# Patient Record
Sex: Male | Born: 1950
Health system: Southern US, Community
[De-identification: ages and names within clinical notes are randomized; demographics above are authoritative.]

## PROBLEM LIST (undated history)

## (undated) DIAGNOSIS — R079 Chest pain, unspecified: Secondary | ICD-10-CM

## (undated) DIAGNOSIS — N419 Inflammatory disease of prostate, unspecified: Secondary | ICD-10-CM

## (undated) DIAGNOSIS — E669 Obesity, unspecified: Secondary | ICD-10-CM

## (undated) DIAGNOSIS — H409 Unspecified glaucoma: Secondary | ICD-10-CM

## (undated) DIAGNOSIS — E119 Type 2 diabetes mellitus without complications: Secondary | ICD-10-CM

## (undated) DIAGNOSIS — K219 Gastro-esophageal reflux disease without esophagitis: Secondary | ICD-10-CM

## (undated) DIAGNOSIS — T7840XA Allergy, unspecified, initial encounter: Secondary | ICD-10-CM

## (undated) DIAGNOSIS — K635 Polyp of colon: Secondary | ICD-10-CM

## (undated) DIAGNOSIS — I499 Cardiac arrhythmia, unspecified: Secondary | ICD-10-CM

## (undated) DIAGNOSIS — G4730 Sleep apnea, unspecified: Secondary | ICD-10-CM

## (undated) DIAGNOSIS — E1149 Type 2 diabetes mellitus with other diabetic neurological complication: Secondary | ICD-10-CM

## (undated) DIAGNOSIS — E785 Hyperlipidemia, unspecified: Secondary | ICD-10-CM

## (undated) DIAGNOSIS — J45909 Unspecified asthma, uncomplicated: Secondary | ICD-10-CM

## (undated) DIAGNOSIS — M1990 Unspecified osteoarthritis, unspecified site: Secondary | ICD-10-CM

## (undated) DIAGNOSIS — E1140 Type 2 diabetes mellitus with diabetic neuropathy, unspecified: Secondary | ICD-10-CM

## (undated) DIAGNOSIS — F329 Major depressive disorder, single episode, unspecified: Secondary | ICD-10-CM

## (undated) DIAGNOSIS — K759 Inflammatory liver disease, unspecified: Secondary | ICD-10-CM

## (undated) DIAGNOSIS — I10 Essential (primary) hypertension: Secondary | ICD-10-CM

## (undated) DIAGNOSIS — K5792 Diverticulitis of intestine, part unspecified, without perforation or abscess without bleeding: Secondary | ICD-10-CM

## (undated) DIAGNOSIS — F32A Depression, unspecified: Secondary | ICD-10-CM

## (undated) DIAGNOSIS — M109 Gout, unspecified: Secondary | ICD-10-CM

## (undated) DIAGNOSIS — J189 Pneumonia, unspecified organism: Secondary | ICD-10-CM

## (undated) HISTORY — DX: Gout, unspecified: M10.9

## (undated) HISTORY — DX: Sleep apnea, unspecified: G47.30

## (undated) HISTORY — PX: BACK SURGERY: SHX140

## (undated) HISTORY — DX: Type 2 diabetes mellitus with diabetic neuropathy, unspecified: E11.40

## (undated) HISTORY — DX: Inflammatory disease of prostate, unspecified: N41.9

## (undated) HISTORY — DX: Unspecified glaucoma: H40.9

## (undated) HISTORY — DX: Hyperlipidemia, unspecified: E78.5

## (undated) HISTORY — PX: VASECTOMY: SHX75

## (undated) HISTORY — DX: Gastro-esophageal reflux disease without esophagitis: K21.9

## (undated) HISTORY — DX: Chest pain, unspecified: R07.9

## (undated) HISTORY — PX: KNEE ARTHROSCOPY: SUR90

## (undated) HISTORY — DX: Obesity, unspecified: E66.9

## (undated) HISTORY — DX: Unspecified asthma, uncomplicated: J45.909

## (undated) HISTORY — PX: TONSILLECTOMY AND ADENOIDECTOMY: SUR1326

## (undated) HISTORY — DX: Depression, unspecified: F32.A

## (undated) HISTORY — DX: Type 2 diabetes mellitus with diabetic neuropathy, unspecified: E11.49

## (undated) HISTORY — DX: Unspecified osteoarthritis, unspecified site: M19.90

## (undated) HISTORY — PX: SHOULDER ARTHROSCOPY: SHX128

## (undated) HISTORY — DX: Essential (primary) hypertension: I10

## (undated) HISTORY — PX: TIBIA FRACTURE SURGERY: SHX806

## (undated) HISTORY — DX: Allergy, unspecified, initial encounter: T78.40XA

---
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
## 2005-10-11 HISTORY — PX: BACK SURGERY: SHX140

## 2016-05-27 HISTORY — PX: COLONOSCOPY: SHX174

## 2016-06-07 DIAGNOSIS — E119 Type 2 diabetes mellitus without complications: Secondary | ICD-10-CM | POA: Diagnosis not present

## 2016-06-07 DIAGNOSIS — Z125 Encounter for screening for malignant neoplasm of prostate: Secondary | ICD-10-CM | POA: Diagnosis not present

## 2016-06-07 DIAGNOSIS — M159 Polyosteoarthritis, unspecified: Secondary | ICD-10-CM | POA: Diagnosis not present

## 2016-06-07 DIAGNOSIS — I10 Essential (primary) hypertension: Secondary | ICD-10-CM | POA: Diagnosis not present

## 2016-06-07 DIAGNOSIS — E785 Hyperlipidemia, unspecified: Secondary | ICD-10-CM | POA: Diagnosis not present

## 2016-07-08 DIAGNOSIS — K219 Gastro-esophageal reflux disease without esophagitis: Secondary | ICD-10-CM | POA: Diagnosis not present

## 2016-07-08 DIAGNOSIS — R6884 Jaw pain: Secondary | ICD-10-CM | POA: Diagnosis not present

## 2016-07-08 DIAGNOSIS — M159 Polyosteoarthritis, unspecified: Secondary | ICD-10-CM | POA: Diagnosis not present

## 2016-07-08 DIAGNOSIS — E119 Type 2 diabetes mellitus without complications: Secondary | ICD-10-CM | POA: Diagnosis not present

## 2016-09-08 DIAGNOSIS — J029 Acute pharyngitis, unspecified: Secondary | ICD-10-CM | POA: Diagnosis not present

## 2016-09-08 DIAGNOSIS — I10 Essential (primary) hypertension: Secondary | ICD-10-CM | POA: Diagnosis not present

## 2016-09-08 DIAGNOSIS — E119 Type 2 diabetes mellitus without complications: Secondary | ICD-10-CM | POA: Diagnosis not present

## 2016-09-08 DIAGNOSIS — K219 Gastro-esophageal reflux disease without esophagitis: Secondary | ICD-10-CM | POA: Diagnosis not present

## 2016-12-09 DIAGNOSIS — J45909 Unspecified asthma, uncomplicated: Secondary | ICD-10-CM | POA: Diagnosis not present

## 2016-12-09 DIAGNOSIS — H409 Unspecified glaucoma: Secondary | ICD-10-CM | POA: Diagnosis not present

## 2016-12-09 DIAGNOSIS — E119 Type 2 diabetes mellitus without complications: Secondary | ICD-10-CM | POA: Diagnosis not present

## 2016-12-09 DIAGNOSIS — J029 Acute pharyngitis, unspecified: Secondary | ICD-10-CM | POA: Diagnosis not present

## 2017-01-20 DIAGNOSIS — M159 Polyosteoarthritis, unspecified: Secondary | ICD-10-CM | POA: Diagnosis not present

## 2017-01-20 DIAGNOSIS — E785 Hyperlipidemia, unspecified: Secondary | ICD-10-CM | POA: Diagnosis not present

## 2017-01-20 DIAGNOSIS — J302 Other seasonal allergic rhinitis: Secondary | ICD-10-CM | POA: Diagnosis not present

## 2017-01-20 DIAGNOSIS — E119 Type 2 diabetes mellitus without complications: Secondary | ICD-10-CM | POA: Diagnosis not present

## 2017-08-11 DIAGNOSIS — S82899A Other fracture of unspecified lower leg, initial encounter for closed fracture: Secondary | ICD-10-CM

## 2017-08-11 HISTORY — DX: Other fracture of unspecified lower leg, initial encounter for closed fracture: S82.899A

## 2017-08-17 DIAGNOSIS — S82891A Other fracture of right lower leg, initial encounter for closed fracture: Secondary | ICD-10-CM | POA: Diagnosis not present

## 2017-08-17 DIAGNOSIS — S8291XA Unspecified fracture of right lower leg, initial encounter for closed fracture: Secondary | ICD-10-CM | POA: Diagnosis not present

## 2017-08-17 DIAGNOSIS — E119 Type 2 diabetes mellitus without complications: Secondary | ICD-10-CM | POA: Diagnosis not present

## 2017-08-17 DIAGNOSIS — S82441A Displaced spiral fracture of shaft of right fibula, initial encounter for closed fracture: Secondary | ICD-10-CM | POA: Diagnosis not present

## 2017-08-22 DIAGNOSIS — S82401A Unspecified fracture of shaft of right fibula, initial encounter for closed fracture: Secondary | ICD-10-CM | POA: Diagnosis not present

## 2017-08-26 DIAGNOSIS — I10 Essential (primary) hypertension: Secondary | ICD-10-CM | POA: Diagnosis not present

## 2017-08-26 DIAGNOSIS — N400 Enlarged prostate without lower urinary tract symptoms: Secondary | ICD-10-CM | POA: Diagnosis not present

## 2017-08-26 DIAGNOSIS — G8918 Other acute postprocedural pain: Secondary | ICD-10-CM | POA: Diagnosis not present

## 2017-08-26 DIAGNOSIS — Z79899 Other long term (current) drug therapy: Secondary | ICD-10-CM | POA: Diagnosis not present

## 2017-08-26 DIAGNOSIS — E119 Type 2 diabetes mellitus without complications: Secondary | ICD-10-CM | POA: Diagnosis not present

## 2017-08-26 DIAGNOSIS — Z7984 Long term (current) use of oral hypoglycemic drugs: Secondary | ICD-10-CM | POA: Diagnosis not present

## 2017-08-26 DIAGNOSIS — S82431A Displaced oblique fracture of shaft of right fibula, initial encounter for closed fracture: Secondary | ICD-10-CM | POA: Diagnosis not present

## 2017-08-26 DIAGNOSIS — E785 Hyperlipidemia, unspecified: Secondary | ICD-10-CM | POA: Diagnosis not present

## 2017-08-26 DIAGNOSIS — S9301XA Subluxation of right ankle joint, initial encounter: Secondary | ICD-10-CM | POA: Diagnosis not present

## 2017-08-26 DIAGNOSIS — S93431A Sprain of tibiofibular ligament of right ankle, initial encounter: Secondary | ICD-10-CM | POA: Diagnosis not present

## 2017-08-26 DIAGNOSIS — S82841A Displaced bimalleolar fracture of right lower leg, initial encounter for closed fracture: Secondary | ICD-10-CM | POA: Diagnosis not present

## 2017-09-07 DIAGNOSIS — S82401A Unspecified fracture of shaft of right fibula, initial encounter for closed fracture: Secondary | ICD-10-CM | POA: Diagnosis not present

## 2017-10-05 DIAGNOSIS — J45909 Unspecified asthma, uncomplicated: Secondary | ICD-10-CM | POA: Diagnosis not present

## 2017-10-05 DIAGNOSIS — E785 Hyperlipidemia, unspecified: Secondary | ICD-10-CM | POA: Diagnosis not present

## 2017-10-05 DIAGNOSIS — K219 Gastro-esophageal reflux disease without esophagitis: Secondary | ICD-10-CM | POA: Diagnosis not present

## 2017-10-05 DIAGNOSIS — R0789 Other chest pain: Secondary | ICD-10-CM | POA: Diagnosis not present

## 2017-10-05 DIAGNOSIS — Z9889 Other specified postprocedural states: Secondary | ICD-10-CM | POA: Diagnosis not present

## 2017-10-05 DIAGNOSIS — R072 Precordial pain: Secondary | ICD-10-CM | POA: Diagnosis not present

## 2017-10-05 DIAGNOSIS — Z7984 Long term (current) use of oral hypoglycemic drugs: Secondary | ICD-10-CM | POA: Diagnosis not present

## 2017-10-05 DIAGNOSIS — Z8781 Personal history of (healed) traumatic fracture: Secondary | ICD-10-CM | POA: Diagnosis not present

## 2017-10-05 DIAGNOSIS — R110 Nausea: Secondary | ICD-10-CM | POA: Diagnosis not present

## 2017-10-05 DIAGNOSIS — I10 Essential (primary) hypertension: Secondary | ICD-10-CM | POA: Diagnosis not present

## 2017-10-05 DIAGNOSIS — Z79899 Other long term (current) drug therapy: Secondary | ICD-10-CM | POA: Diagnosis not present

## 2017-10-05 DIAGNOSIS — E7800 Pure hypercholesterolemia, unspecified: Secondary | ICD-10-CM | POA: Diagnosis not present

## 2017-10-05 DIAGNOSIS — R079 Chest pain, unspecified: Secondary | ICD-10-CM | POA: Diagnosis not present

## 2017-10-05 DIAGNOSIS — R0600 Dyspnea, unspecified: Secondary | ICD-10-CM | POA: Diagnosis not present

## 2017-10-05 DIAGNOSIS — E119 Type 2 diabetes mellitus without complications: Secondary | ICD-10-CM | POA: Diagnosis not present

## 2017-10-05 DIAGNOSIS — S82401A Unspecified fracture of shaft of right fibula, initial encounter for closed fracture: Secondary | ICD-10-CM | POA: Diagnosis not present

## 2017-10-06 DIAGNOSIS — R079 Chest pain, unspecified: Secondary | ICD-10-CM | POA: Diagnosis not present

## 2017-10-06 DIAGNOSIS — R0789 Other chest pain: Secondary | ICD-10-CM | POA: Diagnosis not present

## 2017-10-06 DIAGNOSIS — M7989 Other specified soft tissue disorders: Secondary | ICD-10-CM | POA: Diagnosis not present

## 2017-10-06 DIAGNOSIS — I10 Essential (primary) hypertension: Secondary | ICD-10-CM | POA: Diagnosis not present

## 2017-10-06 DIAGNOSIS — E119 Type 2 diabetes mellitus without complications: Secondary | ICD-10-CM | POA: Diagnosis not present

## 2017-10-10 DIAGNOSIS — S82401D Unspecified fracture of shaft of right fibula, subsequent encounter for closed fracture with routine healing: Secondary | ICD-10-CM | POA: Diagnosis not present

## 2017-10-10 DIAGNOSIS — M6281 Muscle weakness (generalized): Secondary | ICD-10-CM | POA: Diagnosis not present

## 2017-10-10 DIAGNOSIS — M25671 Stiffness of right ankle, not elsewhere classified: Secondary | ICD-10-CM | POA: Diagnosis not present

## 2017-10-10 DIAGNOSIS — R2689 Other abnormalities of gait and mobility: Secondary | ICD-10-CM | POA: Diagnosis not present

## 2017-10-10 DIAGNOSIS — M25571 Pain in right ankle and joints of right foot: Secondary | ICD-10-CM | POA: Diagnosis not present

## 2017-10-14 DIAGNOSIS — R2689 Other abnormalities of gait and mobility: Secondary | ICD-10-CM | POA: Diagnosis not present

## 2017-10-14 DIAGNOSIS — M25571 Pain in right ankle and joints of right foot: Secondary | ICD-10-CM | POA: Diagnosis not present

## 2017-10-14 DIAGNOSIS — S82401D Unspecified fracture of shaft of right fibula, subsequent encounter for closed fracture with routine healing: Secondary | ICD-10-CM | POA: Diagnosis not present

## 2017-10-14 DIAGNOSIS — M6281 Muscle weakness (generalized): Secondary | ICD-10-CM | POA: Diagnosis not present

## 2017-10-14 DIAGNOSIS — M25671 Stiffness of right ankle, not elsewhere classified: Secondary | ICD-10-CM | POA: Diagnosis not present

## 2017-10-17 DIAGNOSIS — M6281 Muscle weakness (generalized): Secondary | ICD-10-CM | POA: Diagnosis not present

## 2017-10-17 DIAGNOSIS — M25571 Pain in right ankle and joints of right foot: Secondary | ICD-10-CM | POA: Diagnosis not present

## 2017-10-17 DIAGNOSIS — M25671 Stiffness of right ankle, not elsewhere classified: Secondary | ICD-10-CM | POA: Diagnosis not present

## 2017-10-17 DIAGNOSIS — S82401D Unspecified fracture of shaft of right fibula, subsequent encounter for closed fracture with routine healing: Secondary | ICD-10-CM | POA: Diagnosis not present

## 2017-10-17 DIAGNOSIS — R2689 Other abnormalities of gait and mobility: Secondary | ICD-10-CM | POA: Diagnosis not present

## 2017-10-24 DIAGNOSIS — S82401D Unspecified fracture of shaft of right fibula, subsequent encounter for closed fracture with routine healing: Secondary | ICD-10-CM | POA: Diagnosis not present

## 2017-10-24 DIAGNOSIS — M25571 Pain in right ankle and joints of right foot: Secondary | ICD-10-CM | POA: Diagnosis not present

## 2017-10-24 DIAGNOSIS — R2689 Other abnormalities of gait and mobility: Secondary | ICD-10-CM | POA: Diagnosis not present

## 2017-10-24 DIAGNOSIS — M6281 Muscle weakness (generalized): Secondary | ICD-10-CM | POA: Diagnosis not present

## 2017-10-24 DIAGNOSIS — M25671 Stiffness of right ankle, not elsewhere classified: Secondary | ICD-10-CM | POA: Diagnosis not present

## 2017-10-27 DIAGNOSIS — R2689 Other abnormalities of gait and mobility: Secondary | ICD-10-CM | POA: Diagnosis not present

## 2017-10-27 DIAGNOSIS — M6281 Muscle weakness (generalized): Secondary | ICD-10-CM | POA: Diagnosis not present

## 2017-10-27 DIAGNOSIS — M25671 Stiffness of right ankle, not elsewhere classified: Secondary | ICD-10-CM | POA: Diagnosis not present

## 2017-10-27 DIAGNOSIS — M25571 Pain in right ankle and joints of right foot: Secondary | ICD-10-CM | POA: Diagnosis not present

## 2017-10-27 DIAGNOSIS — S82401D Unspecified fracture of shaft of right fibula, subsequent encounter for closed fracture with routine healing: Secondary | ICD-10-CM | POA: Diagnosis not present

## 2017-11-01 DIAGNOSIS — S82401D Unspecified fracture of shaft of right fibula, subsequent encounter for closed fracture with routine healing: Secondary | ICD-10-CM | POA: Diagnosis not present

## 2017-11-01 DIAGNOSIS — M25571 Pain in right ankle and joints of right foot: Secondary | ICD-10-CM | POA: Diagnosis not present

## 2017-11-01 DIAGNOSIS — M25671 Stiffness of right ankle, not elsewhere classified: Secondary | ICD-10-CM | POA: Diagnosis not present

## 2017-11-01 DIAGNOSIS — R2689 Other abnormalities of gait and mobility: Secondary | ICD-10-CM | POA: Diagnosis not present

## 2017-11-01 DIAGNOSIS — M6281 Muscle weakness (generalized): Secondary | ICD-10-CM | POA: Diagnosis not present

## 2017-11-04 DIAGNOSIS — M6281 Muscle weakness (generalized): Secondary | ICD-10-CM | POA: Diagnosis not present

## 2017-11-04 DIAGNOSIS — R2689 Other abnormalities of gait and mobility: Secondary | ICD-10-CM | POA: Diagnosis not present

## 2017-11-04 DIAGNOSIS — S82401D Unspecified fracture of shaft of right fibula, subsequent encounter for closed fracture with routine healing: Secondary | ICD-10-CM | POA: Diagnosis not present

## 2017-11-04 DIAGNOSIS — M25671 Stiffness of right ankle, not elsewhere classified: Secondary | ICD-10-CM | POA: Diagnosis not present

## 2017-11-04 DIAGNOSIS — M25571 Pain in right ankle and joints of right foot: Secondary | ICD-10-CM | POA: Diagnosis not present

## 2017-11-17 DIAGNOSIS — R2689 Other abnormalities of gait and mobility: Secondary | ICD-10-CM | POA: Diagnosis not present

## 2017-11-17 DIAGNOSIS — M6281 Muscle weakness (generalized): Secondary | ICD-10-CM | POA: Diagnosis not present

## 2017-11-17 DIAGNOSIS — S82401D Unspecified fracture of shaft of right fibula, subsequent encounter for closed fracture with routine healing: Secondary | ICD-10-CM | POA: Diagnosis not present

## 2017-11-17 DIAGNOSIS — M25571 Pain in right ankle and joints of right foot: Secondary | ICD-10-CM | POA: Diagnosis not present

## 2017-11-17 DIAGNOSIS — M25671 Stiffness of right ankle, not elsewhere classified: Secondary | ICD-10-CM | POA: Diagnosis not present

## 2017-11-21 DIAGNOSIS — M6281 Muscle weakness (generalized): Secondary | ICD-10-CM | POA: Diagnosis not present

## 2017-11-21 DIAGNOSIS — M25671 Stiffness of right ankle, not elsewhere classified: Secondary | ICD-10-CM | POA: Diagnosis not present

## 2017-11-21 DIAGNOSIS — S82401A Unspecified fracture of shaft of right fibula, initial encounter for closed fracture: Secondary | ICD-10-CM | POA: Diagnosis not present

## 2017-11-21 DIAGNOSIS — S82401D Unspecified fracture of shaft of right fibula, subsequent encounter for closed fracture with routine healing: Secondary | ICD-10-CM | POA: Diagnosis not present

## 2017-11-21 DIAGNOSIS — R2689 Other abnormalities of gait and mobility: Secondary | ICD-10-CM | POA: Diagnosis not present

## 2017-11-21 DIAGNOSIS — M25571 Pain in right ankle and joints of right foot: Secondary | ICD-10-CM | POA: Diagnosis not present

## 2017-11-25 DIAGNOSIS — M6281 Muscle weakness (generalized): Secondary | ICD-10-CM | POA: Diagnosis not present

## 2017-11-25 DIAGNOSIS — S82401D Unspecified fracture of shaft of right fibula, subsequent encounter for closed fracture with routine healing: Secondary | ICD-10-CM | POA: Diagnosis not present

## 2017-11-25 DIAGNOSIS — M25671 Stiffness of right ankle, not elsewhere classified: Secondary | ICD-10-CM | POA: Diagnosis not present

## 2017-11-25 DIAGNOSIS — M25571 Pain in right ankle and joints of right foot: Secondary | ICD-10-CM | POA: Diagnosis not present

## 2017-11-25 DIAGNOSIS — R2689 Other abnormalities of gait and mobility: Secondary | ICD-10-CM | POA: Diagnosis not present

## 2017-12-02 DIAGNOSIS — S82401D Unspecified fracture of shaft of right fibula, subsequent encounter for closed fracture with routine healing: Secondary | ICD-10-CM | POA: Diagnosis not present

## 2017-12-02 DIAGNOSIS — R2689 Other abnormalities of gait and mobility: Secondary | ICD-10-CM | POA: Diagnosis not present

## 2017-12-02 DIAGNOSIS — M6281 Muscle weakness (generalized): Secondary | ICD-10-CM | POA: Diagnosis not present

## 2017-12-02 DIAGNOSIS — M25671 Stiffness of right ankle, not elsewhere classified: Secondary | ICD-10-CM | POA: Diagnosis not present

## 2017-12-02 DIAGNOSIS — M25571 Pain in right ankle and joints of right foot: Secondary | ICD-10-CM | POA: Diagnosis not present

## 2017-12-05 DIAGNOSIS — M6281 Muscle weakness (generalized): Secondary | ICD-10-CM | POA: Diagnosis not present

## 2017-12-05 DIAGNOSIS — R2689 Other abnormalities of gait and mobility: Secondary | ICD-10-CM | POA: Diagnosis not present

## 2017-12-05 DIAGNOSIS — S82401D Unspecified fracture of shaft of right fibula, subsequent encounter for closed fracture with routine healing: Secondary | ICD-10-CM | POA: Diagnosis not present

## 2017-12-05 DIAGNOSIS — M25671 Stiffness of right ankle, not elsewhere classified: Secondary | ICD-10-CM | POA: Diagnosis not present

## 2017-12-05 DIAGNOSIS — M25571 Pain in right ankle and joints of right foot: Secondary | ICD-10-CM | POA: Diagnosis not present

## 2017-12-08 DIAGNOSIS — R2689 Other abnormalities of gait and mobility: Secondary | ICD-10-CM | POA: Diagnosis not present

## 2017-12-08 DIAGNOSIS — M25671 Stiffness of right ankle, not elsewhere classified: Secondary | ICD-10-CM | POA: Diagnosis not present

## 2017-12-08 DIAGNOSIS — S82401D Unspecified fracture of shaft of right fibula, subsequent encounter for closed fracture with routine healing: Secondary | ICD-10-CM | POA: Diagnosis not present

## 2017-12-08 DIAGNOSIS — M25571 Pain in right ankle and joints of right foot: Secondary | ICD-10-CM | POA: Diagnosis not present

## 2017-12-08 DIAGNOSIS — M6281 Muscle weakness (generalized): Secondary | ICD-10-CM | POA: Diagnosis not present

## 2017-12-13 DIAGNOSIS — M25671 Stiffness of right ankle, not elsewhere classified: Secondary | ICD-10-CM | POA: Diagnosis not present

## 2017-12-13 DIAGNOSIS — S82401D Unspecified fracture of shaft of right fibula, subsequent encounter for closed fracture with routine healing: Secondary | ICD-10-CM | POA: Diagnosis not present

## 2017-12-13 DIAGNOSIS — R2689 Other abnormalities of gait and mobility: Secondary | ICD-10-CM | POA: Diagnosis not present

## 2017-12-13 DIAGNOSIS — M25571 Pain in right ankle and joints of right foot: Secondary | ICD-10-CM | POA: Diagnosis not present

## 2017-12-13 DIAGNOSIS — M6281 Muscle weakness (generalized): Secondary | ICD-10-CM | POA: Diagnosis not present

## 2017-12-15 DIAGNOSIS — S82401D Unspecified fracture of shaft of right fibula, subsequent encounter for closed fracture with routine healing: Secondary | ICD-10-CM | POA: Diagnosis not present

## 2017-12-15 DIAGNOSIS — M25671 Stiffness of right ankle, not elsewhere classified: Secondary | ICD-10-CM | POA: Diagnosis not present

## 2017-12-15 DIAGNOSIS — M6281 Muscle weakness (generalized): Secondary | ICD-10-CM | POA: Diagnosis not present

## 2017-12-15 DIAGNOSIS — R2689 Other abnormalities of gait and mobility: Secondary | ICD-10-CM | POA: Diagnosis not present

## 2017-12-15 DIAGNOSIS — M25571 Pain in right ankle and joints of right foot: Secondary | ICD-10-CM | POA: Diagnosis not present

## 2017-12-20 DIAGNOSIS — Z7689 Persons encountering health services in other specified circumstances: Secondary | ICD-10-CM | POA: Diagnosis not present

## 2017-12-20 DIAGNOSIS — Z6834 Body mass index (BMI) 34.0-34.9, adult: Secondary | ICD-10-CM | POA: Diagnosis not present

## 2017-12-23 DIAGNOSIS — M25571 Pain in right ankle and joints of right foot: Secondary | ICD-10-CM | POA: Diagnosis not present

## 2017-12-23 DIAGNOSIS — S82401D Unspecified fracture of shaft of right fibula, subsequent encounter for closed fracture with routine healing: Secondary | ICD-10-CM | POA: Diagnosis not present

## 2017-12-23 DIAGNOSIS — R2689 Other abnormalities of gait and mobility: Secondary | ICD-10-CM | POA: Diagnosis not present

## 2017-12-23 DIAGNOSIS — M6281 Muscle weakness (generalized): Secondary | ICD-10-CM | POA: Diagnosis not present

## 2017-12-23 DIAGNOSIS — M25671 Stiffness of right ankle, not elsewhere classified: Secondary | ICD-10-CM | POA: Diagnosis not present

## 2017-12-29 DIAGNOSIS — M6281 Muscle weakness (generalized): Secondary | ICD-10-CM | POA: Diagnosis not present

## 2017-12-29 DIAGNOSIS — S82401D Unspecified fracture of shaft of right fibula, subsequent encounter for closed fracture with routine healing: Secondary | ICD-10-CM | POA: Diagnosis not present

## 2017-12-29 DIAGNOSIS — M25571 Pain in right ankle and joints of right foot: Secondary | ICD-10-CM | POA: Diagnosis not present

## 2017-12-29 DIAGNOSIS — R2689 Other abnormalities of gait and mobility: Secondary | ICD-10-CM | POA: Diagnosis not present

## 2017-12-29 DIAGNOSIS — M25671 Stiffness of right ankle, not elsewhere classified: Secondary | ICD-10-CM | POA: Diagnosis not present

## 2018-01-02 DIAGNOSIS — S82401D Unspecified fracture of shaft of right fibula, subsequent encounter for closed fracture with routine healing: Secondary | ICD-10-CM | POA: Diagnosis not present

## 2018-01-02 DIAGNOSIS — M25571 Pain in right ankle and joints of right foot: Secondary | ICD-10-CM | POA: Diagnosis not present

## 2018-01-02 DIAGNOSIS — M6281 Muscle weakness (generalized): Secondary | ICD-10-CM | POA: Diagnosis not present

## 2018-01-02 DIAGNOSIS — R2689 Other abnormalities of gait and mobility: Secondary | ICD-10-CM | POA: Diagnosis not present

## 2018-01-02 DIAGNOSIS — M25671 Stiffness of right ankle, not elsewhere classified: Secondary | ICD-10-CM | POA: Diagnosis not present

## 2018-01-05 DIAGNOSIS — S82401D Unspecified fracture of shaft of right fibula, subsequent encounter for closed fracture with routine healing: Secondary | ICD-10-CM | POA: Diagnosis not present

## 2018-01-05 DIAGNOSIS — M6281 Muscle weakness (generalized): Secondary | ICD-10-CM | POA: Diagnosis not present

## 2018-01-05 DIAGNOSIS — M25671 Stiffness of right ankle, not elsewhere classified: Secondary | ICD-10-CM | POA: Diagnosis not present

## 2018-01-05 DIAGNOSIS — M25571 Pain in right ankle and joints of right foot: Secondary | ICD-10-CM | POA: Diagnosis not present

## 2018-01-05 DIAGNOSIS — R2689 Other abnormalities of gait and mobility: Secondary | ICD-10-CM | POA: Diagnosis not present

## 2018-01-09 DIAGNOSIS — R2689 Other abnormalities of gait and mobility: Secondary | ICD-10-CM | POA: Diagnosis not present

## 2018-01-09 DIAGNOSIS — M25571 Pain in right ankle and joints of right foot: Secondary | ICD-10-CM | POA: Diagnosis not present

## 2018-01-09 DIAGNOSIS — S82401D Unspecified fracture of shaft of right fibula, subsequent encounter for closed fracture with routine healing: Secondary | ICD-10-CM | POA: Diagnosis not present

## 2018-01-09 DIAGNOSIS — M6281 Muscle weakness (generalized): Secondary | ICD-10-CM | POA: Diagnosis not present

## 2018-01-09 DIAGNOSIS — M25671 Stiffness of right ankle, not elsewhere classified: Secondary | ICD-10-CM | POA: Diagnosis not present

## 2018-01-12 DIAGNOSIS — R2689 Other abnormalities of gait and mobility: Secondary | ICD-10-CM | POA: Diagnosis not present

## 2018-01-12 DIAGNOSIS — M25571 Pain in right ankle and joints of right foot: Secondary | ICD-10-CM | POA: Diagnosis not present

## 2018-01-12 DIAGNOSIS — S82401D Unspecified fracture of shaft of right fibula, subsequent encounter for closed fracture with routine healing: Secondary | ICD-10-CM | POA: Diagnosis not present

## 2018-01-12 DIAGNOSIS — M6281 Muscle weakness (generalized): Secondary | ICD-10-CM | POA: Diagnosis not present

## 2018-01-12 DIAGNOSIS — M25671 Stiffness of right ankle, not elsewhere classified: Secondary | ICD-10-CM | POA: Diagnosis not present

## 2018-02-15 DIAGNOSIS — S161XXA Strain of muscle, fascia and tendon at neck level, initial encounter: Secondary | ICD-10-CM | POA: Diagnosis not present

## 2018-02-15 DIAGNOSIS — Z6834 Body mass index (BMI) 34.0-34.9, adult: Secondary | ICD-10-CM | POA: Diagnosis not present

## 2018-02-27 DIAGNOSIS — S82401A Unspecified fracture of shaft of right fibula, initial encounter for closed fracture: Secondary | ICD-10-CM | POA: Diagnosis not present

## 2018-03-27 DIAGNOSIS — N419 Inflammatory disease of prostate, unspecified: Secondary | ICD-10-CM | POA: Diagnosis not present

## 2018-03-27 DIAGNOSIS — Z139 Encounter for screening, unspecified: Secondary | ICD-10-CM | POA: Diagnosis not present

## 2018-03-27 DIAGNOSIS — Z1331 Encounter for screening for depression: Secondary | ICD-10-CM | POA: Diagnosis not present

## 2018-03-27 DIAGNOSIS — A419 Sepsis, unspecified organism: Secondary | ICD-10-CM | POA: Diagnosis not present

## 2018-04-25 DIAGNOSIS — E1140 Type 2 diabetes mellitus with diabetic neuropathy, unspecified: Secondary | ICD-10-CM | POA: Diagnosis not present

## 2018-04-25 DIAGNOSIS — I10 Essential (primary) hypertension: Secondary | ICD-10-CM | POA: Diagnosis not present

## 2018-04-25 DIAGNOSIS — E1169 Type 2 diabetes mellitus with other specified complication: Secondary | ICD-10-CM | POA: Diagnosis not present

## 2018-05-01 DIAGNOSIS — E782 Mixed hyperlipidemia: Secondary | ICD-10-CM | POA: Diagnosis not present

## 2018-05-01 DIAGNOSIS — I10 Essential (primary) hypertension: Secondary | ICD-10-CM | POA: Diagnosis not present

## 2018-05-01 DIAGNOSIS — E1140 Type 2 diabetes mellitus with diabetic neuropathy, unspecified: Secondary | ICD-10-CM | POA: Diagnosis not present

## 2018-05-01 DIAGNOSIS — Z139 Encounter for screening, unspecified: Secondary | ICD-10-CM | POA: Diagnosis not present

## 2018-05-01 DIAGNOSIS — E1169 Type 2 diabetes mellitus with other specified complication: Secondary | ICD-10-CM | POA: Diagnosis not present

## 2018-08-01 DIAGNOSIS — I10 Essential (primary) hypertension: Secondary | ICD-10-CM | POA: Diagnosis not present

## 2018-08-01 DIAGNOSIS — Z125 Encounter for screening for malignant neoplasm of prostate: Secondary | ICD-10-CM | POA: Diagnosis not present

## 2018-08-01 DIAGNOSIS — E1140 Type 2 diabetes mellitus with diabetic neuropathy, unspecified: Secondary | ICD-10-CM | POA: Diagnosis not present

## 2018-08-01 DIAGNOSIS — E1169 Type 2 diabetes mellitus with other specified complication: Secondary | ICD-10-CM | POA: Diagnosis not present

## 2018-08-10 DIAGNOSIS — Z1331 Encounter for screening for depression: Secondary | ICD-10-CM | POA: Diagnosis not present

## 2018-08-10 DIAGNOSIS — E1140 Type 2 diabetes mellitus with diabetic neuropathy, unspecified: Secondary | ICD-10-CM | POA: Diagnosis not present

## 2018-08-10 DIAGNOSIS — E1169 Type 2 diabetes mellitus with other specified complication: Secondary | ICD-10-CM | POA: Diagnosis not present

## 2018-08-10 DIAGNOSIS — Z0000 Encounter for general adult medical examination without abnormal findings: Secondary | ICD-10-CM | POA: Diagnosis not present

## 2018-08-10 DIAGNOSIS — E538 Deficiency of other specified B group vitamins: Secondary | ICD-10-CM | POA: Diagnosis not present

## 2018-08-10 DIAGNOSIS — M6281 Muscle weakness (generalized): Secondary | ICD-10-CM | POA: Diagnosis not present

## 2018-08-10 DIAGNOSIS — E782 Mixed hyperlipidemia: Secondary | ICD-10-CM | POA: Diagnosis not present

## 2018-08-10 DIAGNOSIS — Z9181 History of falling: Secondary | ICD-10-CM | POA: Diagnosis not present

## 2018-08-10 DIAGNOSIS — Z139 Encounter for screening, unspecified: Secondary | ICD-10-CM | POA: Diagnosis not present

## 2018-08-15 DIAGNOSIS — E538 Deficiency of other specified B group vitamins: Secondary | ICD-10-CM | POA: Diagnosis not present

## 2018-08-22 DIAGNOSIS — E538 Deficiency of other specified B group vitamins: Secondary | ICD-10-CM | POA: Diagnosis not present

## 2018-08-29 DIAGNOSIS — E538 Deficiency of other specified B group vitamins: Secondary | ICD-10-CM | POA: Diagnosis not present

## 2018-09-11 DIAGNOSIS — E538 Deficiency of other specified B group vitamins: Secondary | ICD-10-CM | POA: Diagnosis not present

## 2018-12-01 DIAGNOSIS — E1140 Type 2 diabetes mellitus with diabetic neuropathy, unspecified: Secondary | ICD-10-CM | POA: Diagnosis not present

## 2018-12-01 DIAGNOSIS — E1169 Type 2 diabetes mellitus with other specified complication: Secondary | ICD-10-CM | POA: Diagnosis not present

## 2018-12-01 DIAGNOSIS — I10 Essential (primary) hypertension: Secondary | ICD-10-CM | POA: Diagnosis not present

## 2018-12-08 DIAGNOSIS — E782 Mixed hyperlipidemia: Secondary | ICD-10-CM | POA: Diagnosis not present

## 2018-12-08 DIAGNOSIS — N183 Chronic kidney disease, stage 3 (moderate): Secondary | ICD-10-CM | POA: Diagnosis not present

## 2018-12-08 DIAGNOSIS — E1169 Type 2 diabetes mellitus with other specified complication: Secondary | ICD-10-CM | POA: Diagnosis not present

## 2018-12-08 DIAGNOSIS — I129 Hypertensive chronic kidney disease with stage 1 through stage 4 chronic kidney disease, or unspecified chronic kidney disease: Secondary | ICD-10-CM | POA: Diagnosis not present

## 2018-12-19 DIAGNOSIS — M25511 Pain in right shoulder: Secondary | ICD-10-CM | POA: Diagnosis not present

## 2018-12-19 DIAGNOSIS — Z139 Encounter for screening, unspecified: Secondary | ICD-10-CM | POA: Diagnosis not present

## 2018-12-19 DIAGNOSIS — Z6834 Body mass index (BMI) 34.0-34.9, adult: Secondary | ICD-10-CM | POA: Diagnosis not present

## 2018-12-22 DIAGNOSIS — M542 Cervicalgia: Secondary | ICD-10-CM | POA: Diagnosis not present

## 2018-12-22 DIAGNOSIS — Z789 Other specified health status: Secondary | ICD-10-CM | POA: Diagnosis not present

## 2018-12-22 DIAGNOSIS — M19019 Primary osteoarthritis, unspecified shoulder: Secondary | ICD-10-CM | POA: Diagnosis not present

## 2018-12-22 DIAGNOSIS — Z6834 Body mass index (BMI) 34.0-34.9, adult: Secondary | ICD-10-CM | POA: Diagnosis not present

## 2019-01-05 DIAGNOSIS — M4722 Other spondylosis with radiculopathy, cervical region: Secondary | ICD-10-CM | POA: Diagnosis not present

## 2019-01-05 DIAGNOSIS — Z6834 Body mass index (BMI) 34.0-34.9, adult: Secondary | ICD-10-CM | POA: Diagnosis not present

## 2019-01-05 DIAGNOSIS — M4712 Other spondylosis with myelopathy, cervical region: Secondary | ICD-10-CM | POA: Diagnosis not present

## 2019-01-09 DIAGNOSIS — I129 Hypertensive chronic kidney disease with stage 1 through stage 4 chronic kidney disease, or unspecified chronic kidney disease: Secondary | ICD-10-CM | POA: Diagnosis not present

## 2019-01-09 DIAGNOSIS — E1169 Type 2 diabetes mellitus with other specified complication: Secondary | ICD-10-CM | POA: Diagnosis not present

## 2019-01-09 DIAGNOSIS — N183 Chronic kidney disease, stage 3 (moderate): Secondary | ICD-10-CM | POA: Diagnosis not present

## 2019-01-09 DIAGNOSIS — E782 Mixed hyperlipidemia: Secondary | ICD-10-CM | POA: Diagnosis not present

## 2019-02-08 DIAGNOSIS — N183 Chronic kidney disease, stage 3 (moderate): Secondary | ICD-10-CM | POA: Diagnosis not present

## 2019-02-08 DIAGNOSIS — E782 Mixed hyperlipidemia: Secondary | ICD-10-CM | POA: Diagnosis not present

## 2019-02-08 DIAGNOSIS — E1169 Type 2 diabetes mellitus with other specified complication: Secondary | ICD-10-CM | POA: Diagnosis not present

## 2019-02-08 DIAGNOSIS — I129 Hypertensive chronic kidney disease with stage 1 through stage 4 chronic kidney disease, or unspecified chronic kidney disease: Secondary | ICD-10-CM | POA: Diagnosis not present

## 2019-03-09 DIAGNOSIS — E1169 Type 2 diabetes mellitus with other specified complication: Secondary | ICD-10-CM | POA: Diagnosis not present

## 2019-03-09 DIAGNOSIS — N183 Chronic kidney disease, stage 3 (moderate): Secondary | ICD-10-CM | POA: Diagnosis not present

## 2019-03-09 DIAGNOSIS — I129 Hypertensive chronic kidney disease with stage 1 through stage 4 chronic kidney disease, or unspecified chronic kidney disease: Secondary | ICD-10-CM | POA: Diagnosis not present

## 2019-03-09 DIAGNOSIS — E782 Mixed hyperlipidemia: Secondary | ICD-10-CM | POA: Diagnosis not present

## 2019-03-29 DIAGNOSIS — M25511 Pain in right shoulder: Secondary | ICD-10-CM | POA: Diagnosis not present

## 2019-04-02 DIAGNOSIS — I10 Essential (primary) hypertension: Secondary | ICD-10-CM | POA: Diagnosis not present

## 2019-04-02 DIAGNOSIS — E1169 Type 2 diabetes mellitus with other specified complication: Secondary | ICD-10-CM | POA: Diagnosis not present

## 2019-04-02 DIAGNOSIS — E1140 Type 2 diabetes mellitus with diabetic neuropathy, unspecified: Secondary | ICD-10-CM | POA: Diagnosis not present

## 2019-04-09 DIAGNOSIS — E1169 Type 2 diabetes mellitus with other specified complication: Secondary | ICD-10-CM | POA: Diagnosis not present

## 2019-04-09 DIAGNOSIS — E782 Mixed hyperlipidemia: Secondary | ICD-10-CM | POA: Diagnosis not present

## 2019-04-09 DIAGNOSIS — I10 Essential (primary) hypertension: Secondary | ICD-10-CM | POA: Diagnosis not present

## 2019-04-09 DIAGNOSIS — E1140 Type 2 diabetes mellitus with diabetic neuropathy, unspecified: Secondary | ICD-10-CM | POA: Diagnosis not present

## 2019-04-10 DIAGNOSIS — I10 Essential (primary) hypertension: Secondary | ICD-10-CM | POA: Diagnosis not present

## 2019-04-10 DIAGNOSIS — E1140 Type 2 diabetes mellitus with diabetic neuropathy, unspecified: Secondary | ICD-10-CM | POA: Diagnosis not present

## 2019-04-10 DIAGNOSIS — E1169 Type 2 diabetes mellitus with other specified complication: Secondary | ICD-10-CM | POA: Diagnosis not present

## 2019-04-10 DIAGNOSIS — E782 Mixed hyperlipidemia: Secondary | ICD-10-CM | POA: Diagnosis not present

## 2019-05-11 DIAGNOSIS — I10 Essential (primary) hypertension: Secondary | ICD-10-CM | POA: Diagnosis not present

## 2019-05-11 DIAGNOSIS — E782 Mixed hyperlipidemia: Secondary | ICD-10-CM | POA: Diagnosis not present

## 2019-05-11 DIAGNOSIS — E1169 Type 2 diabetes mellitus with other specified complication: Secondary | ICD-10-CM | POA: Diagnosis not present

## 2019-05-11 DIAGNOSIS — E1140 Type 2 diabetes mellitus with diabetic neuropathy, unspecified: Secondary | ICD-10-CM | POA: Diagnosis not present

## 2019-06-11 DIAGNOSIS — E1140 Type 2 diabetes mellitus with diabetic neuropathy, unspecified: Secondary | ICD-10-CM | POA: Diagnosis not present

## 2019-06-11 DIAGNOSIS — E1169 Type 2 diabetes mellitus with other specified complication: Secondary | ICD-10-CM | POA: Diagnosis not present

## 2019-06-11 DIAGNOSIS — I10 Essential (primary) hypertension: Secondary | ICD-10-CM | POA: Diagnosis not present

## 2019-06-11 DIAGNOSIS — E782 Mixed hyperlipidemia: Secondary | ICD-10-CM | POA: Diagnosis not present

## 2019-07-11 DIAGNOSIS — E782 Mixed hyperlipidemia: Secondary | ICD-10-CM | POA: Diagnosis not present

## 2019-07-11 DIAGNOSIS — E1140 Type 2 diabetes mellitus with diabetic neuropathy, unspecified: Secondary | ICD-10-CM | POA: Diagnosis not present

## 2019-07-11 DIAGNOSIS — E1169 Type 2 diabetes mellitus with other specified complication: Secondary | ICD-10-CM | POA: Diagnosis not present

## 2019-07-11 DIAGNOSIS — I10 Essential (primary) hypertension: Secondary | ICD-10-CM | POA: Diagnosis not present

## 2019-07-16 DIAGNOSIS — K625 Hemorrhage of anus and rectum: Secondary | ICD-10-CM | POA: Diagnosis not present

## 2019-07-16 DIAGNOSIS — Z6834 Body mass index (BMI) 34.0-34.9, adult: Secondary | ICD-10-CM | POA: Diagnosis not present

## 2019-08-01 DIAGNOSIS — I10 Essential (primary) hypertension: Secondary | ICD-10-CM | POA: Diagnosis not present

## 2019-08-01 DIAGNOSIS — E1169 Type 2 diabetes mellitus with other specified complication: Secondary | ICD-10-CM | POA: Diagnosis not present

## 2019-08-08 DIAGNOSIS — E1169 Type 2 diabetes mellitus with other specified complication: Secondary | ICD-10-CM | POA: Diagnosis not present

## 2019-08-08 DIAGNOSIS — Z23 Encounter for immunization: Secondary | ICD-10-CM | POA: Diagnosis not present

## 2019-08-08 DIAGNOSIS — E782 Mixed hyperlipidemia: Secondary | ICD-10-CM | POA: Diagnosis not present

## 2019-08-10 DIAGNOSIS — E782 Mixed hyperlipidemia: Secondary | ICD-10-CM | POA: Diagnosis not present

## 2019-08-10 DIAGNOSIS — E1169 Type 2 diabetes mellitus with other specified complication: Secondary | ICD-10-CM | POA: Diagnosis not present

## 2019-08-10 DIAGNOSIS — I10 Essential (primary) hypertension: Secondary | ICD-10-CM | POA: Diagnosis not present

## 2019-08-13 DIAGNOSIS — Z139 Encounter for screening, unspecified: Secondary | ICD-10-CM | POA: Diagnosis not present

## 2019-08-13 DIAGNOSIS — Z0000 Encounter for general adult medical examination without abnormal findings: Secondary | ICD-10-CM | POA: Diagnosis not present

## 2019-08-13 DIAGNOSIS — Z7189 Other specified counseling: Secondary | ICD-10-CM | POA: Diagnosis not present

## 2019-08-13 DIAGNOSIS — Z1331 Encounter for screening for depression: Secondary | ICD-10-CM | POA: Diagnosis not present

## 2019-08-13 DIAGNOSIS — Z1339 Encounter for screening examination for other mental health and behavioral disorders: Secondary | ICD-10-CM | POA: Diagnosis not present

## 2019-08-24 DIAGNOSIS — F331 Major depressive disorder, recurrent, moderate: Secondary | ICD-10-CM | POA: Diagnosis not present

## 2019-08-24 DIAGNOSIS — Z9181 History of falling: Secondary | ICD-10-CM | POA: Diagnosis not present

## 2019-08-24 DIAGNOSIS — Z6834 Body mass index (BMI) 34.0-34.9, adult: Secondary | ICD-10-CM | POA: Diagnosis not present

## 2019-09-10 DIAGNOSIS — I10 Essential (primary) hypertension: Secondary | ICD-10-CM | POA: Diagnosis not present

## 2019-09-10 DIAGNOSIS — F331 Major depressive disorder, recurrent, moderate: Secondary | ICD-10-CM | POA: Diagnosis not present

## 2019-09-10 DIAGNOSIS — E1169 Type 2 diabetes mellitus with other specified complication: Secondary | ICD-10-CM | POA: Diagnosis not present

## 2019-09-10 DIAGNOSIS — E782 Mixed hyperlipidemia: Secondary | ICD-10-CM | POA: Diagnosis not present

## 2019-10-29 DIAGNOSIS — Z6835 Body mass index (BMI) 35.0-35.9, adult: Secondary | ICD-10-CM | POA: Diagnosis not present

## 2019-10-29 DIAGNOSIS — F331 Major depressive disorder, recurrent, moderate: Secondary | ICD-10-CM | POA: Diagnosis not present

## 2019-11-09 DIAGNOSIS — F331 Major depressive disorder, recurrent, moderate: Secondary | ICD-10-CM | POA: Diagnosis not present

## 2019-11-09 DIAGNOSIS — E782 Mixed hyperlipidemia: Secondary | ICD-10-CM | POA: Diagnosis not present

## 2019-11-09 DIAGNOSIS — I10 Essential (primary) hypertension: Secondary | ICD-10-CM | POA: Diagnosis not present

## 2019-11-09 DIAGNOSIS — E1169 Type 2 diabetes mellitus with other specified complication: Secondary | ICD-10-CM | POA: Diagnosis not present

## 2019-11-14 ENCOUNTER — Encounter: Payer: Self-pay | Admitting: Gastroenterology

## 2019-11-21 ENCOUNTER — Other Ambulatory Visit: Payer: Self-pay

## 2019-11-21 ENCOUNTER — Ambulatory Visit (AMBULATORY_SURGERY_CENTER): Payer: Self-pay | Admitting: *Deleted

## 2019-11-21 VITALS — Temp 97.8°F | Ht 68.0 in | Wt 232.0 lb

## 2019-11-21 DIAGNOSIS — Z86010 Personal history of colonic polyps: Secondary | ICD-10-CM

## 2019-11-21 NOTE — Progress Notes (Signed)
Patient is here in-person for PV. Patient denies any allergies to eggs or soy. Patient denies any problems with anesthesia/sedation. Patient denies any oxygen use at home. Patient denies taking any diet/weight loss medications or blood thinners. Patient is not being treated for MRSA or C-diff.   NO covid test, pt has had both vaccines, last dose on 11/17/2019 per pt.   Pt is aware that care partner will wait in the car during procedure; if they feel like they will be too hot or cold to wait in the car; they may wait in the 4 th floor lobby. Patient is aware to bring only one care partner. We want them to wear a mask (we do not have any that we can provide them), practice social distancing, and we will check their temperatures when they get here.  I did remind the patient that their care partner needs to stay in the parking lot the entire time and have a cell phone available, we will call them when the pt is ready for discharge. Patient will wear mask into building.

## 2019-11-28 DIAGNOSIS — Z125 Encounter for screening for malignant neoplasm of prostate: Secondary | ICD-10-CM | POA: Diagnosis not present

## 2019-11-28 DIAGNOSIS — E1169 Type 2 diabetes mellitus with other specified complication: Secondary | ICD-10-CM | POA: Diagnosis not present

## 2019-12-02 DIAGNOSIS — K635 Polyp of colon: Secondary | ICD-10-CM

## 2019-12-05 ENCOUNTER — Other Ambulatory Visit: Payer: Self-pay

## 2019-12-05 ENCOUNTER — Ambulatory Visit (AMBULATORY_SURGERY_CENTER): Payer: Medicare Other | Admitting: Gastroenterology

## 2019-12-05 ENCOUNTER — Encounter: Payer: Self-pay | Admitting: Gastroenterology

## 2019-12-05 VITALS — BP 123/70 | HR 68 | Temp 97.5°F | Resp 16 | Ht 68.0 in | Wt 232.0 lb

## 2019-12-05 DIAGNOSIS — K219 Gastro-esophageal reflux disease without esophagitis: Secondary | ICD-10-CM | POA: Diagnosis not present

## 2019-12-05 DIAGNOSIS — D123 Benign neoplasm of transverse colon: Secondary | ICD-10-CM

## 2019-12-05 DIAGNOSIS — E119 Type 2 diabetes mellitus without complications: Secondary | ICD-10-CM | POA: Diagnosis not present

## 2019-12-05 DIAGNOSIS — D120 Benign neoplasm of cecum: Secondary | ICD-10-CM

## 2019-12-05 DIAGNOSIS — I10 Essential (primary) hypertension: Secondary | ICD-10-CM | POA: Diagnosis not present

## 2019-12-05 DIAGNOSIS — Z86010 Personal history of colonic polyps: Secondary | ICD-10-CM | POA: Diagnosis not present

## 2019-12-05 DIAGNOSIS — K635 Polyp of colon: Secondary | ICD-10-CM

## 2019-12-05 DIAGNOSIS — D124 Benign neoplasm of descending colon: Secondary | ICD-10-CM

## 2019-12-05 MED ORDER — SODIUM CHLORIDE 0.9 % IV SOLN: 500.0000 mL | Freq: Once | INTRAVENOUS | Status: DC

## 2019-12-05 NOTE — Progress Notes (Signed)
Pt's states no medical or surgical changes since previsit or office visit.  Temp- June Vitals- Donna 

## 2019-12-05 NOTE — Op Note (Signed)
[facility] Patient Name: Johnny Vasquez Procedure Date: 12/05/2019 9:02 AM MRN: [id number] Endoscopist: Thornton Park MD, MD Age: 69 Referring MD:  Date of Birth: 21-Dec-1950 Gender: Male Account #: [id number] Procedure:                Colonoscopy Indications:              Surveillance: Personal history of adenomatous                            polyps on last colonoscopy 3 years ago                           [facility]                           Colonoscopy 02/26/16: transverse colon tubular                            adenoma >3cm in size                           Colonoscopy 05/27/16: no residaul polyp                           Colonoscopy 11/11/16: 18mm hepatic flexure tubular                            adenoma                           Colonoscopy recommended in 3 years                           No known family history of colon cancer or polyps Medicines:                Monitored Anesthesia Care Procedure:                Pre-Anesthesia Assessment:                           - Prior to the procedure, a History and Physical                            was performed, and patient medications and                            allergies were reviewed. The patient's tolerance of                            previous anesthesia was also reviewed. The risks                            and benefits of the procedure and the sedation                            options and risks were discussed with the patient.  All questions were answered, and informed consent                            was obtained. Prior Anticoagulants: The patient has                            taken no previous anticoagulant or antiplatelet                            agents. ASA Grade Assessment: II - A patient with                            mild systemic disease. After reviewing the risks                            and benefits, the patient was deemed in          satisfactory condition to undergo the procedure.                           After obtaining informed consent, the colonoscope                            was passed under direct vision. Throughout the                            procedure, the patient's blood pressure, pulse, and                            oxygen saturations were monitored continuously. The                            Colonoscope was introduced through the anus and                            advanced to the 5 cm into the ileum. A second                            forward view of the right colon was performed. The                            terminal ileum, ileocecal valve, appendiceal                            orifice, and rectum were photographed. Scope In: 9:07:35 AM Scope Out: 9:23:15 AM Scope Withdrawal Time: 0 hours 12 minutes 52 seconds  Total Procedure Duration: 0 hours 15 minutes 40 seconds  Findings:                 The perianal and digital rectal examinations were                            normal.  Non-bleeding internal hemorrhoids were found.                           A few small-mouthed diverticula were found in the                            sigmoid colon and descending colon.                           A 3 mm polyp was found in the descending colon. The                            polyp was sessile. The polyp was removed with a                            cold snare. Resection and retrieval were complete.                            Estimated blood loss was minimal.                           A 2 mm polyp was found in the hepatic flexure. The                            polyp was flat. The polyp was removed with a cold                            snare. Resection and retrieval were complete.                            Estimated blood loss was minimal.                           A localized area of adenomatous appearning mucosa                            was found at the ileocecal valve.  Biopsies were                            taken with a cold forceps for histology to evaluate                            for possible adenoma. Estimated blood loss was                            minimal.                           The terminal ileum appeared normal.                           Prior tattoo seen in the transverse colon. No  residual polyp identified. The exam was otherwise                            without abnormality on direct and retroflexion                            views. Complications:            No immediate complications. Estimated blood loss:                            Minimal. Estimated Blood Loss:     Estimated blood loss was minimal. Impression:               - Non-bleeding internal hemorrhoids.                           - Diverticulosis in the sigmoid colon and in the                            descending colon.                           - One 3 mm polyp in the descending colon, removed                            with a cold snare. Resected and retrieved.                           - One 2 mm polyp at the hepatic flexure, removed                            with a cold snare. Resected and retrieved.                           - Abnormal mucosa at the ileocecal valve. Biopsied.                           - The examined portion of the ileum was normal.                           - The examination was otherwise normal on direct                            and retroflexion views. Recommendation:           - Patient has a contact number available for                            emergencies. The signs and symptoms of potential                            delayed complications were discussed with the                            patient. Return to normal activities  tomorrow.                            Written discharge instructions were provided to the                            patient.                           - High fiber diet. Drink at least 64  ounces of                            water daily. Consider adding a daily stool bulking                            agent such as Metamucil.                           - Continue present medications.                           - Await pathology results.                           - Repeat colonoscopy date to be determined after                            pending pathology results from the polyps and the                            IC valve biopsies are reviewed. Thornton Park MD, MD 12/05/2019 9:33:17 AM This report has been signed electronically.

## 2019-12-05 NOTE — Patient Instructions (Signed)
3 polyps removed. High fiber diet.  Drink at least 64 ounces of water daily.  Consider adding a daily stool bulking agent such as Metamucil. Please read all handouts.    YOU HAD AN ENDOSCOPIC PROCEDURE TODAY AT [facility]:   Refer to the procedure report that was given to you for any specific questions about what was found during the examination.  If the procedure report does not answer your questions, please call your gastroenterologist to clarify.  If you requested that your care partner not be given the details of your procedure findings, then the procedure report has been included in a sealed envelope for you to review at your convenience later.  YOU SHOULD EXPECT: Some feelings of bloating in the abdomen. Passage of more gas than usual.  Walking can help get rid of the air that was put into your GI tract during the procedure and reduce the bloating. If you had a lower endoscopy (such as a colonoscopy or flexible sigmoidoscopy) you may notice spotting of blood in your stool or on the toilet paper. If you underwent a bowel prep for your procedure, you may not have a normal bowel movement for a few days.  Please Note:  You might notice some irritation and congestion in your nose or some drainage.  This is from the oxygen used during your procedure.  There is no need for concern and it should clear up in a day or so.  SYMPTOMS TO REPORT IMMEDIATELY:   Following lower endoscopy (colonoscopy or flexible sigmoidoscopy):  Excessive amounts of blood in the stool  Significant tenderness or worsening of abdominal pains  Swelling of the abdomen that is new, acute  Fever of 100F or higher   For urgent or emergent issues, a gastroenterologist can be reached at any hour by calling [phone].   DIET:  We do recommend a small meal at first, but then you may proceed to your regular diet.  Drink plenty of fluids but you should avoid alcoholic beverages for 24 hours.  ACTIVITY:   You should plan to take it easy for the rest of today and you should NOT DRIVE or use heavy machinery until tomorrow (because of the sedation medicines used during the test).    FOLLOW UP: Our staff will call the number listed on your records 48-72 hours following your procedure to check on you and address any questions or concerns that you may have regarding the information given to you following your procedure. If we do not reach you, we will leave a message.  We will attempt to reach you two times.  During this call, we will ask if you have developed any symptoms of COVID 19. If you develop any symptoms (ie: fever, flu-like symptoms, shortness of breath, cough etc.) before then, please call [phone].  If you test positive for Covid 19 in the 2 weeks post procedure, please call and report this information to [place].    If any biopsies were taken you will be contacted by phone or by letter within the next 1-3 weeks.  Please call us at [phone] if you have not heard about the biopsies in 3 weeks.    SIGNATURES/CONFIDENTIALITY: You and/or your care partner have signed paperwork which will be entered into your electronic medical record.  These signatures attest to the fact that that the information above on your After Visit Summary has been reviewed and is understood.  Full responsibility of the confidentiality of this discharge information  lies with you and/or your care-partner.

## 2019-12-05 NOTE — Progress Notes (Signed)
Report to PACU, RN, vss, BBS= Clear.  

## 2019-12-07 ENCOUNTER — Telehealth: Payer: Self-pay | Admitting: *Deleted

## 2019-12-07 DIAGNOSIS — E1169 Type 2 diabetes mellitus with other specified complication: Secondary | ICD-10-CM | POA: Diagnosis not present

## 2019-12-07 DIAGNOSIS — E782 Mixed hyperlipidemia: Secondary | ICD-10-CM | POA: Diagnosis not present

## 2019-12-07 DIAGNOSIS — I10 Essential (primary) hypertension: Secondary | ICD-10-CM | POA: Diagnosis not present

## 2019-12-07 DIAGNOSIS — E1140 Type 2 diabetes mellitus with diabetic neuropathy, unspecified: Secondary | ICD-10-CM | POA: Diagnosis not present

## 2019-12-07 NOTE — Telephone Encounter (Signed)
Attempted second f/u phone call. No answer. Left message.  

## 2019-12-07 NOTE — Telephone Encounter (Signed)
Attempted f/u phone call. No answer. Left message. °

## 2019-12-09 DIAGNOSIS — E1140 Type 2 diabetes mellitus with diabetic neuropathy, unspecified: Secondary | ICD-10-CM | POA: Diagnosis not present

## 2019-12-09 DIAGNOSIS — I10 Essential (primary) hypertension: Secondary | ICD-10-CM | POA: Diagnosis not present

## 2019-12-09 DIAGNOSIS — E1169 Type 2 diabetes mellitus with other specified complication: Secondary | ICD-10-CM | POA: Diagnosis not present

## 2019-12-09 DIAGNOSIS — E782 Mixed hyperlipidemia: Secondary | ICD-10-CM | POA: Diagnosis not present

## 2019-12-11 ENCOUNTER — Telehealth: Payer: Self-pay

## 2019-12-11 NOTE — Telephone Encounter (Signed)
-----   Message from Sheffield Slider sent at 12/11/2019  4:11 PM EST ----- This patient has an auth for [place] valid until 11/20/2020 anything that's done within our office pertaining to why he was referred is covered under that [place] number ----- Message ----- From: Darden Dates Sent: 12/11/2019   3:37 PM EST To: Sheffield Slider, Timothy Lasso, RN  Es, can you look into this for Lateefa Crosby? [facility] Thanks, Amy ----- Message ----- From: Timothy Lasso, RN Sent: 12/11/2019   2:15 PM EST To: Darden Dates  Amy [facility] and needs [facility] colon EMR with Dr Rush Landmark .  He has an appt to discuss with Dr Jerilynn Mages on 4/22 but he would like to know if his insurance will cover our office or if he will need to go to [facility].  Can you help with this? No hurry.

## 2019-12-19 DIAGNOSIS — Z6835 Body mass index (BMI) 35.0-35.9, adult: Secondary | ICD-10-CM | POA: Diagnosis not present

## 2019-12-19 DIAGNOSIS — E669 Obesity, unspecified: Secondary | ICD-10-CM | POA: Diagnosis not present

## 2020-01-02 DIAGNOSIS — E669 Obesity, unspecified: Secondary | ICD-10-CM | POA: Diagnosis not present

## 2020-01-02 DIAGNOSIS — Z6835 Body mass index (BMI) 35.0-35.9, adult: Secondary | ICD-10-CM | POA: Diagnosis not present

## 2020-01-09 DIAGNOSIS — E1169 Type 2 diabetes mellitus with other specified complication: Secondary | ICD-10-CM | POA: Diagnosis not present

## 2020-01-09 DIAGNOSIS — E1140 Type 2 diabetes mellitus with diabetic neuropathy, unspecified: Secondary | ICD-10-CM | POA: Diagnosis not present

## 2020-01-09 DIAGNOSIS — E782 Mixed hyperlipidemia: Secondary | ICD-10-CM | POA: Diagnosis not present

## 2020-01-09 DIAGNOSIS — Z6835 Body mass index (BMI) 35.0-35.9, adult: Secondary | ICD-10-CM | POA: Diagnosis not present

## 2020-01-16 DIAGNOSIS — Z6835 Body mass index (BMI) 35.0-35.9, adult: Secondary | ICD-10-CM | POA: Diagnosis not present

## 2020-01-16 DIAGNOSIS — E669 Obesity, unspecified: Secondary | ICD-10-CM | POA: Diagnosis not present

## 2020-01-31 ENCOUNTER — Telehealth: Payer: Self-pay

## 2020-01-31 ENCOUNTER — Ambulatory Visit: Payer: Medicare Other | Admitting: Gastroenterology

## 2020-01-31 NOTE — Telephone Encounter (Signed)
The pt has been scheduled for 6/2 at 1010 am.  Pt aware

## 2020-01-31 NOTE — Telephone Encounter (Signed)
-----   Message from Irving Copas., MD sent at 01/31/2020  9:47 AM EDT ----- Regarding: Missed appointment Johnny Vasquez, Can you reach out to patient and get him rescheduled for clinic visit to discuss EMR of his ICV Polyp?  He missed his appointment today. KB - FYI about missed appointment. Thanks. GM

## 2020-02-08 DIAGNOSIS — E782 Mixed hyperlipidemia: Secondary | ICD-10-CM | POA: Diagnosis not present

## 2020-02-08 DIAGNOSIS — Z6835 Body mass index (BMI) 35.0-35.9, adult: Secondary | ICD-10-CM | POA: Diagnosis not present

## 2020-02-08 DIAGNOSIS — E1140 Type 2 diabetes mellitus with diabetic neuropathy, unspecified: Secondary | ICD-10-CM | POA: Diagnosis not present

## 2020-02-08 DIAGNOSIS — E669 Obesity, unspecified: Secondary | ICD-10-CM | POA: Diagnosis not present

## 2020-02-29 DIAGNOSIS — E1169 Type 2 diabetes mellitus with other specified complication: Secondary | ICD-10-CM | POA: Diagnosis not present

## 2020-03-07 DIAGNOSIS — I10 Essential (primary) hypertension: Secondary | ICD-10-CM | POA: Diagnosis not present

## 2020-03-07 DIAGNOSIS — E785 Hyperlipidemia, unspecified: Secondary | ICD-10-CM | POA: Diagnosis not present

## 2020-03-07 DIAGNOSIS — E1169 Type 2 diabetes mellitus with other specified complication: Secondary | ICD-10-CM | POA: Diagnosis not present

## 2020-03-07 DIAGNOSIS — E782 Mixed hyperlipidemia: Secondary | ICD-10-CM | POA: Diagnosis not present

## 2020-03-10 DIAGNOSIS — E782 Mixed hyperlipidemia: Secondary | ICD-10-CM | POA: Diagnosis not present

## 2020-03-10 DIAGNOSIS — E785 Hyperlipidemia, unspecified: Secondary | ICD-10-CM | POA: Diagnosis not present

## 2020-03-10 DIAGNOSIS — I10 Essential (primary) hypertension: Secondary | ICD-10-CM | POA: Diagnosis not present

## 2020-03-10 DIAGNOSIS — E1169 Type 2 diabetes mellitus with other specified complication: Secondary | ICD-10-CM | POA: Diagnosis not present

## 2020-03-12 ENCOUNTER — Ambulatory Visit: Payer: Medicare Other | Admitting: Gastroenterology

## 2020-03-20 ENCOUNTER — Encounter: Payer: Self-pay | Admitting: Gastroenterology

## 2020-03-20 ENCOUNTER — Ambulatory Visit (INDEPENDENT_AMBULATORY_CARE_PROVIDER_SITE_OTHER): Payer: Medicare Other | Admitting: Gastroenterology

## 2020-03-20 ENCOUNTER — Other Ambulatory Visit (INDEPENDENT_AMBULATORY_CARE_PROVIDER_SITE_OTHER): Payer: Medicare Other

## 2020-03-20 VITALS — BP 128/82 | HR 88 | Ht 68.0 in | Wt 227.4 lb

## 2020-03-20 DIAGNOSIS — Z86010 Personal history of colonic polyps: Secondary | ICD-10-CM | POA: Diagnosis not present

## 2020-03-20 DIAGNOSIS — K6389 Other specified diseases of intestine: Secondary | ICD-10-CM | POA: Diagnosis not present

## 2020-03-20 LAB — BASIC METABOLIC PANEL
BUN: 16 mg/dL (ref 6–23)
CO2: 27 mEq/L (ref 19–32)
Calcium: 9.6 mg/dL (ref 8.4–10.5)
Chloride: 101 mEq/L (ref 96–112)
Creatinine, Ser: 1.21 mg/dL (ref 0.40–1.50)
GFR: 71.45 mL/min (ref >60.00)
Glucose, Bld: 254 mg/dL — ABNORMAL HIGH (ref 70–99)
Potassium: 3.9 mEq/L (ref 3.5–5.1)
Sodium: 136 mEq/L (ref 135–145)

## 2020-03-20 LAB — CBC
HCT: 44.7 % (ref 39.0–52.0)
Hemoglobin: 15 g/dL (ref 13.0–17.0)
MCHC: 33.5 g/dL (ref 30.0–36.0)
MCV: 88.5 fl (ref 78.0–100.0)
Platelets: 186 10*3/uL (ref 150.0–400.0)
RBC: 5.05 Mil/uL (ref 4.22–5.81)
RDW: 13.5 % (ref 11.5–15.5)
WBC: 5.2 10*3/uL (ref 4.0–10.5)

## 2020-03-20 LAB — PROTIME-INR
INR: 1.1 ratio — ABNORMAL HIGH (ref 0.8–1.0)
Prothrombin Time: 12.5 s (ref 9.6–13.1)

## 2020-03-20 NOTE — Patient Instructions (Addendum)
If you are age 71 or older, your body mass index should be between 23-30. Your Body mass index is 34.57 kg/m. If this is out of the aforementioned range listed, please consider follow up with your Primary Care Provider.  If you are age 24 or younger, your body mass index should be between 19-25. Your Body mass index is 34.57 kg/m. If this is out of the aformentioned range listed, please consider follow up with your Primary Care Provider.   You have been scheduled for a colonoscopy. Please follow written instructions given to you at your visit today.  Please pick up your prep supplies at the pharmacy within the next 1-3 days. If you use inhalers (even only as needed), please bring them with you on the day of your procedure.  _ x _   ORAL DIABETIC MEDICATION INSTRUCTIONS  The day before your procedure:  Take your diabetic pill as you do normally  The day of your procedure:  Do not take your diabetic pill   We will check your blood sugar levels during the admission process and again in Recovery before discharging you home  Thank you for choosing me and [facility].  Dr. Rush Landmark

## 2020-03-20 NOTE — Progress Notes (Signed)
[place] VISIT   Primary Care Provider Maryella Shivers, [place] [address] [phone]  Referring Provider Dr. Tarri Glenn   Patient Profile: Ramsey Guadamuz is a 69 y.o. male with a pmh significant for asthma, MDD, glaucoma, gout, hypertension, hyperlipidemia, sleep apnea, GERD, colon polyps.  The patient presents to the [facility] for an evaluation and management of problem(s) noted below:  Problem List 1. Hx of colonic polyps   2. Ileal polyp     History of Present Illness This is a patient who is referred by Dr. Tarri Glenn after recent colonoscopy for consideration of advanced polyp resection in the setting of an ileocecal valve sessile serrated adenoma.  The patient's history per prior notation in the chart had suggested that the patient has had multiple polyps over the course of the last few years.  Back in May 2017 he had a tubular adenoma that was noted to be greater than 3 cm in size.  Repeat colonoscopy 2017 did not show any residual polyp.  In 2018 a 3 mm hepatic flexure polyp was removed and a colonoscopy was recommended in 3 years.  [facility] and Dr. Tarri Glenn pursue this in February of this year.  Dr. Tarri Glenn found some adenomatous appearing mucosa at and in the ileocecal valve.  She found a few small polyps in the descending colon and hepatic flexure.  All biopsies and pathology showed evidence of sessile serrated adenoma.  The patient is otherwise feeling well and not having significant GI symptoms at this time.  GI Review of Systems Positive as above Negative for dysphagia, odynophagia, pain, change in bowel habits  Review of Systems General: Denies fevers/chills/weight loss unintentionally Cardiovascular: Denies chest pain/palpitations Pulmonary: Denies shortness of breath Gastroenterological: See HPI Genitourinary: Denies darkened urine Hematological:  Denies easy bruising/bleeding Dermatological: Denies jaundice Psychological: Mood is stable   Medications Current Outpatient Medications  Medication Sig Dispense Refill  . allopurinol (ZYLOPRIM) 300 MG tablet Take 300 mg by mouth daily.    Marland Kitchen amLODipine (NORVASC) 5 MG tablet Take 5 mg by mouth daily.    . ASPIRIN PO Take by mouth.    . brimonidine (ALPHAGAN) 0.2 % ophthalmic solution 3 (three) times daily.    . carboxymethylcellulose (REFRESH PLUS) 0.5 % SOLN 1 drop 3 (three) times daily as needed.    . Cholecalciferol (VITAMIN D) 50 MCG (2000 UT) tablet Take 2,000 Units by mouth daily.    Marland Kitchen gabapentin (NEURONTIN) 300 MG capsule     . latanoprost (XALATAN) 0.005 % ophthalmic solution 1 drop at bedtime.    Marland Kitchen LORATADINE PO Take by mouth.    . losartan (COZAAR) 100 MG tablet Take 100 mg by mouth daily.    . meloxicam (MOBIC) 15 MG tablet Take 15 mg by mouth daily.    Marland Kitchen MENTHOL-METHYL SALICYLATE EX Apply topically.    . metFORMIN (GLUCOPHAGE-XR) 500 MG 24 hr tablet Take 500 mg by mouth 2 (two) times daily.    Marland Kitchen omeprazole (PRILOSEC) 20 MG capsule     . pravastatin (PRAVACHOL) 40 MG tablet Take 40 mg by mouth daily.    Marland Kitchen PROAIR HFA 108 (90 Base) MCG/ACT inhaler     . SILDENAFIL CITRATE PO Take by mouth.    . TAMSULOSIN HCL PO Take by mouth.     No current facility-administered medications for this visit.    Allergies Allergies  Allergen Reactions  . Aspirin Other (See Comments)  GI upset     Histories Past Medical History:  Diagnosis Date  . Allergy   . Arthritis   . Asthma   . Depression   . GERD (gastroesophageal reflux disease)   . Glaucoma   . Gout   . Hyperlipidemia   . Hypertension   . Sleep apnea    uses CPAP   Past Surgical History:  Procedure Laterality Date  . BACK SURGERY     x2  . COLONOSCOPY  05/27/2016  . KNEE ARTHROSCOPY Bilateral   . SHOULDER ARTHROSCOPY    . TIBIA FRACTURE SURGERY    . VASECTOMY     Social History   Socioeconomic History   . Marital status: Married    Spouse name: Not on file  . Number of children: Not on file  . Years of education: Not on file  . Highest education level: Not on file  Occupational History  . Not on file  Tobacco Use  . Smoking status: Never Smoker  . Smokeless tobacco: Never Used  Vaping Use  . Vaping Use: Never used  Substance and Sexual Activity  . Alcohol use: Not Currently  . Drug use: Not Currently  . Sexual activity: Not on file  Other Topics Concern  . Not on file  Social History Narrative  . Not on file   Social Determinants of Health   Financial Resource Strain:   . Difficulty of Paying Living Expenses:   Food Insecurity:   . Worried About Charity fundraiser in the Last Year:   . Arboriculturist in the Last Year:   Transportation Needs:   . Film/video editor (Medical):   Marland Kitchen Lack of Transportation (Non-Medical):   Physical Activity:   . Days of Exercise per Week:   . Minutes of Exercise per Session:   Stress:   . Feeling of Stress :   Social Connections:   . Frequency of Communication with Friends and Family:   . Frequency of Social Gatherings with Friends and Family:   . Attends Religious Services:   . Active Member of Clubs or Organizations:   . Attends Archivist Meetings:   Marland Kitchen Marital Status:   Intimate Partner Violence:   . Fear of Current or Ex-Partner:   . Emotionally Abused:   Marland Kitchen Physically Abused:   . Sexually Abused:    Family History  Problem Relation Age of Onset  . Stomach cancer Maternal Aunt   . Stomach cancer Maternal Aunt   . Colon cancer Neg Hx   . Colon polyps Neg Hx   . Esophageal cancer Neg Hx   . Inflammatory bowel disease Neg Hx   . Liver disease Neg Hx   . Rectal cancer Neg Hx   . Pancreatic cancer Neg Hx    I have reviewed his medical, social, and family history in detail and updated the electronic medical record as necessary.    PHYSICAL EXAMINATION  BP 128/82   Pulse 88   Ht 5\' 8"  (1.727 m)   Wt 227 lb  6 oz (103.1 kg)   BMI 34.57 kg/m  Wt Readings from Last 3 Encounters:  03/20/20 227 lb 6 oz (103.1 kg)  12/05/19 232 lb (105.2 kg)  11/21/19 232 lb (105.2 kg)  GEN: NAD, appears stated age, doesn't appear chronically ill PSYCH: Cooperative, without pressured speech EYE: Conjunctivae pink, sclerae anicteric ENT: MMM CV: Nontachycardic RESP: No audible wheezing present GI: Obese, rounded, nontender, no rebound MSK/EXT: No lower extremity  edema SKIN: No jaundice NEURO:  Alert & Oriented x 3, no focal deficits   REVIEW OF DATA  I reviewed the following data at the time of this encounter:  GI Procedures and Studies  February 2021 colonoscopy - Non-bleeding internal hemorrhoids. - Diverticulosis in the sigmoid colon and in the descending colon. - One 3 mm polyp in the descending colon, removed with a cold snare. Resected and retrieved. - One 2 mm polyp at the hepatic flexure, removed with a cold snare. Resected and retrieved. - Abnormal mucosa at the ileocecal valve. Biopsied. - The examined portion of the ileum was normal. - The examination was otherwise normal on direct and retroflexion views. Pathology Diagnosis 1. Surgical [P], small bowel, ileocecal valve, polyp - SESSILE SERRATED POLYP WITHOUT CYTOLOGIC DYSPLASIA. 2. Surgical [P], colon, hepatic flexure, descending, polyp (2) - SESSILE SERRATED POLYP WITHOUT CYTOLOGIC DYSPLASIA (X 2).  Previously reviewed colonoscopies by Dr. Primus Bravo did not have the formal reports to see myself Colonoscopy 02/26/16: transverse colon tubular adenoma >3cm in size Colonoscopy 05/27/16: no residual polyp Colonoscopy 11/11/16: 3 mm hepatic flexure tubular adenoma Colonoscopy recommended in 3 years  Laboratory Studies  Reviewed those in epic  Imaging Studies  No relevant studies to review   ASSESSMENT  Mr. Wittwer is a 69 y.o. male with a pmh significant for asthma, MDD, glaucoma, gout, hypertension, hyperlipidemia, sleep apnea, GERD,  colon polyps.   The patient is seen today for evaluation and management of:  1. Hx of colonic polyps   2. Ileal polyp    The patient is hemodynamically and clinically stable.  Based upon the description and endoscopic pictures I do feel that it is reasonable to pursue an Advanced Polypectomy attempt of the polyp/lesion.  We discussed some of the techniques of advanced polypectomy which include Endoscopic Mucosal Resection, OVESCO Full-Thickness Resection, Endorotor Morcellation, and Tissue Ablation via Fulguration.  The risks and benefits of endoscopic evaluation were discussed with the patient; these include but are not limited to the risk of perforation, infection, bleeding, missed lesions, lack of diagnosis, severe illness requiring hospitalization, as well as anesthesia and sedation related illnesses.  During attempts at advanced resection, the risks of bleeding and perforation/leak are increased as opposed to diagnostic and screening procedures, and that was discussed with the patient as well.   In addition, I explained that with the possible need for piecemeal resection, subsequent short-interval endoscopic evaluation for follow up and potential retreatment of the lesion/area may be necessary.  I did offer, a referral to surgery in order for patient to have opportunity to discuss surgical management/intervention prior to finalizing decision for attempt at endoscopic removal, however, the patient deferred on this.  If, after attempt at removal of the polyp/lesion, it is found that the patient has a complication or that an invasive lesion or malignant lesion is found, or that the polyp/lesion continues to recur, the patient is aware and understands that surgery may still be indicated/required.  All patient questions were answered, to the best of my ability, and the patient agrees to the aforementioned plan of action with follow-up as indicated.   PLAN  Preprocedure labs as noted below Proceed with  scheduling colonoscopy with EMR attempt   Orders Placed This Encounter  Procedures  . Procedural/ Surgical Case Request: COLONOSCOPY WITH PROPOFOL, ENDOSCOPIC MUCOSAL RESECTION  . CBC  . Basic Metabolic Panel (BMET)  . INR/PT  . Ambulatory referral to Gastroenterology    New Prescriptions   No medications on file  Modified Medications   No medications on file    Planned Follow Up No follow-ups on file.   Total Time in Face-to-Face and in Coordination of Care for patient including independent/personal interpretation/review of prior testing, medical history, examination, medication adjustment, communicating results with the patient directly, and documentation with the EHR is 30 minutes.   Justice Britain, MD [facility] Advanced Endoscopy Office # [phone]

## 2020-03-23 ENCOUNTER — Encounter: Payer: Self-pay | Admitting: Gastroenterology

## 2020-05-08 ENCOUNTER — Other Ambulatory Visit (HOSPITAL_COMMUNITY)
Admission: RE | Admit: 2020-05-08 | Discharge: 2020-05-08 | Disposition: A | Discharge status: Home / Self Care | Payer: Medicare Other | Source: Ambulatory Visit | Attending: Gastroenterology | Admitting: Gastroenterology

## 2020-05-08 DIAGNOSIS — Z01812 Encounter for preprocedural laboratory examination: Secondary | ICD-10-CM | POA: Insufficient documentation

## 2020-05-08 DIAGNOSIS — Z20822 Contact with and (suspected) exposure to covid-19: Secondary | ICD-10-CM | POA: Insufficient documentation

## 2020-05-08 LAB — SARS CORONAVIRUS 2 (TAT 6-24 HRS): SARS Coronavirus 2: NEGATIVE

## 2020-05-09 ENCOUNTER — Encounter (HOSPITAL_COMMUNITY): Payer: Self-pay | Admitting: Gastroenterology

## 2020-05-09 ENCOUNTER — Other Ambulatory Visit: Payer: Self-pay

## 2020-05-09 NOTE — Progress Notes (Signed)
Spoke with pt for pre-op call. Pt states he was told when he was in the [employer] that he had an irregular heart rate. States it's never given him any problems. Pt is a type 2 diabetic. Last A1C was 7.1 two months ago. Pt states his fasting blood sugar is usually between 106-113. Instructed pt to check his blood sugar Monday AM when he gets up and every 2 hours until he leaves for the hospital. If blood sugar is 70 or below, treat with 1/2 cup of clear juice (apple or cranberry) and recheck blood sugar 15 minutes after drinking juice.   Covid test done on 05/08/20 and it's negative. Pt states he's been in quarantine since the test was done and understands that he stays in quarantine until he comes to the hospital on Monday.  Pt needs an EKG day of procedure

## 2020-05-09 NOTE — Progress Notes (Signed)
Pre op call complete for endo procedure Monday 05/12/20. Patient states he has been quarantined at home since COVID test, and will remain so over the weekend. Patient states he has clear instructions about colon prep and will be NPO after prep. Patient confirms has a ride home following procedure. All questions addressed.

## 2020-05-11 DIAGNOSIS — E785 Hyperlipidemia, unspecified: Secondary | ICD-10-CM | POA: Diagnosis not present

## 2020-05-11 DIAGNOSIS — I10 Essential (primary) hypertension: Secondary | ICD-10-CM | POA: Diagnosis not present

## 2020-05-11 DIAGNOSIS — E1169 Type 2 diabetes mellitus with other specified complication: Secondary | ICD-10-CM | POA: Diagnosis not present

## 2020-05-11 DIAGNOSIS — E782 Mixed hyperlipidemia: Secondary | ICD-10-CM | POA: Diagnosis not present

## 2020-05-12 ENCOUNTER — Other Ambulatory Visit: Payer: Self-pay

## 2020-05-12 ENCOUNTER — Encounter (HOSPITAL_COMMUNITY): Payer: Self-pay | Admitting: Gastroenterology

## 2020-05-12 ENCOUNTER — Encounter (HOSPITAL_COMMUNITY)
Admission: RE | Disposition: A | Discharge status: Home / Self Care | Payer: Self-pay | Source: Home / Self Care | Attending: Gastroenterology

## 2020-05-12 ENCOUNTER — Ambulatory Visit (HOSPITAL_COMMUNITY): Payer: Medicare Other | Admitting: Certified Registered Nurse Anesthetist

## 2020-05-12 ENCOUNTER — Ambulatory Visit (HOSPITAL_COMMUNITY)
Admission: RE | Admit: 2020-05-12 | Discharge: 2020-05-12 | Disposition: A | Discharge status: Home / Self Care | Payer: Medicare Other | Attending: Gastroenterology | Admitting: Gastroenterology

## 2020-05-12 DIAGNOSIS — D1339 Benign neoplasm of other parts of small intestine: Secondary | ICD-10-CM | POA: Diagnosis not present

## 2020-05-12 DIAGNOSIS — M1990 Unspecified osteoarthritis, unspecified site: Secondary | ICD-10-CM | POA: Insufficient documentation

## 2020-05-12 DIAGNOSIS — K635 Polyp of colon: Secondary | ICD-10-CM

## 2020-05-12 DIAGNOSIS — G4730 Sleep apnea, unspecified: Secondary | ICD-10-CM | POA: Insufficient documentation

## 2020-05-12 DIAGNOSIS — E785 Hyperlipidemia, unspecified: Secondary | ICD-10-CM | POA: Insufficient documentation

## 2020-05-12 DIAGNOSIS — H42 Glaucoma in diseases classified elsewhere: Secondary | ICD-10-CM | POA: Diagnosis not present

## 2020-05-12 DIAGNOSIS — Z800 Family history of malignant neoplasm of digestive organs: Secondary | ICD-10-CM | POA: Diagnosis not present

## 2020-05-12 DIAGNOSIS — E1139 Type 2 diabetes mellitus with other diabetic ophthalmic complication: Secondary | ICD-10-CM | POA: Diagnosis not present

## 2020-05-12 DIAGNOSIS — F4310 Post-traumatic stress disorder, unspecified: Secondary | ICD-10-CM | POA: Insufficient documentation

## 2020-05-12 DIAGNOSIS — K5730 Diverticulosis of large intestine without perforation or abscess without bleeding: Secondary | ICD-10-CM | POA: Diagnosis not present

## 2020-05-12 DIAGNOSIS — Z860100 Personal history of colon polyps, unspecified: Secondary | ICD-10-CM

## 2020-05-12 DIAGNOSIS — K644 Residual hemorrhoidal skin tags: Secondary | ICD-10-CM | POA: Insufficient documentation

## 2020-05-12 DIAGNOSIS — K641 Second degree hemorrhoids: Secondary | ICD-10-CM | POA: Diagnosis not present

## 2020-05-12 DIAGNOSIS — J45909 Unspecified asthma, uncomplicated: Secondary | ICD-10-CM | POA: Insufficient documentation

## 2020-05-12 DIAGNOSIS — K219 Gastro-esophageal reflux disease without esophagitis: Secondary | ICD-10-CM | POA: Insufficient documentation

## 2020-05-12 DIAGNOSIS — K6389 Other specified diseases of intestine: Secondary | ICD-10-CM

## 2020-05-12 DIAGNOSIS — D120 Benign neoplasm of cecum: Secondary | ICD-10-CM | POA: Diagnosis not present

## 2020-05-12 DIAGNOSIS — I499 Cardiac arrhythmia, unspecified: Secondary | ICD-10-CM | POA: Diagnosis not present

## 2020-05-12 DIAGNOSIS — I10 Essential (primary) hypertension: Secondary | ICD-10-CM | POA: Insufficient documentation

## 2020-05-12 DIAGNOSIS — Z886 Allergy status to analgesic agent status: Secondary | ICD-10-CM | POA: Insufficient documentation

## 2020-05-12 HISTORY — DX: Pneumonia, unspecified organism: J18.9

## 2020-05-12 HISTORY — DX: Cardiac arrhythmia, unspecified: I49.9

## 2020-05-12 HISTORY — PX: ENDOSCOPIC MUCOSAL RESECTION: SHX6839

## 2020-05-12 HISTORY — PX: SUBMUCOSAL LIFTING INJECTION: SHX6855

## 2020-05-12 HISTORY — PX: HEMOSTASIS CLIP PLACEMENT: SHX6857

## 2020-05-12 HISTORY — DX: Polyp of colon: K63.5

## 2020-05-12 HISTORY — DX: Inflammatory liver disease, unspecified: K75.9

## 2020-05-12 HISTORY — DX: Diverticulitis of intestine, part unspecified, without perforation or abscess without bleeding: K57.92

## 2020-05-12 HISTORY — PX: POLYPECTOMY: SHX5525

## 2020-05-12 HISTORY — PX: COLONOSCOPY WITH PROPOFOL: SHX5780

## 2020-05-12 HISTORY — DX: Type 2 diabetes mellitus without complications: E11.9

## 2020-05-12 LAB — GLUCOSE, CAPILLARY
Glucose-Capillary: 120 mg/dL — ABNORMAL HIGH (ref 70–99)
Glucose-Capillary: 112 mg/dL — ABNORMAL HIGH (ref 70–99)

## 2020-05-12 SURGERY — COLONOSCOPY WITH PROPOFOL
Anesthesia: Monitor Anesthesia Care

## 2020-05-12 MED ORDER — LACTATED RINGERS IV SOLN
INTRAVENOUS | Status: DC
  Administered 2020-05-12: 1000 mL via INTRAVENOUS

## 2020-05-12 MED ORDER — SODIUM CHLORIDE 0.9 % IV SOLN: INTRAVENOUS | Status: DC

## 2020-05-12 MED ORDER — PROPOFOL 500 MG/50ML IV EMUL
INTRAVENOUS | Status: DC | PRN
  Administered 2020-05-12: 85 ug/kg/min via INTRAVENOUS
  Administered 2020-05-12: 100 ug/kg/min via INTRAVENOUS
  Administered 2020-05-12: 80 ug/kg/min via INTRAVENOUS

## 2020-05-12 MED ORDER — ONDANSETRON HCL 4 MG/2ML IJ SOLN: 4.0000 mg | Freq: Once | INTRAMUSCULAR | Status: DC | PRN

## 2020-05-12 MED ORDER — FENTANYL CITRATE (PF) 100 MCG/2ML IJ SOLN: 25.0000 ug | INTRAMUSCULAR | Status: DC | PRN

## 2020-05-12 MED ORDER — PROPOFOL 10 MG/ML IV BOLUS
INTRAVENOUS | Status: DC | PRN
  Administered 2020-05-12: 15 mg via INTRAVENOUS
  Administered 2020-05-12: 10 mg via INTRAVENOUS
  Administered 2020-05-12: 20 mg via INTRAVENOUS

## 2020-05-12 MED ORDER — PHENYLEPHRINE 40 MCG/ML (10ML) SYRINGE FOR IV PUSH (FOR BLOOD PRESSURE SUPPORT)
PREFILLED_SYRINGE | INTRAVENOUS | Status: DC | PRN
  Administered 2020-05-12 (×3): 80 ug via INTRAVENOUS
  Administered 2020-05-12: 120 ug via INTRAVENOUS
  Administered 2020-05-12: 80 ug via INTRAVENOUS

## 2020-05-12 SURGICAL SUPPLY — 21 items

## 2020-05-12 NOTE — Op Note (Signed)
[facility] Patient Name: Johnny Vasquez Procedure Date : 05/12/2020 MRN: [id number] Attending MD: Justice Britain , MD Date of Birth: [birth year] CSN: [id number] Age: 69 Admit Type: Outpatient Procedure:                Colonoscopy Indications:              Excision of colonic polyp Providers:                Justice Britain, MD, Jeanella Cara, RN,                            Laverda Sorenson, Technician, Mora Appl, CRNA Referring MD:             Thornton Park MD, MD Medicines:                Monitored Anesthesia Care Complications:            No immediate complications. Estimated Blood Loss:     Estimated blood loss was minimal. Procedure:                Pre-Anesthesia Assessment:                           - Prior to the procedure, a History and Physical                            was performed, and patient medications and                            allergies were reviewed. The patient's tolerance of                            previous anesthesia was also reviewed. The risks                            and benefits of the procedure and the sedation                            options and risks were discussed with the patient.                            All questions were answered, and informed consent                            was obtained. Prior Anticoagulants: The patient has                            taken no previous anticoagulant or antiplatelet                            agents. ASA Grade Assessment: III - A patient with                            severe systemic disease. After reviewing the risks  and benefits, the patient was deemed in                            satisfactory condition to undergo the procedure.                           After obtaining informed consent, the colonoscope                            was passed under direct vision. Throughout the                            procedure, the patient's blood pressure,  pulse, and                            oxygen saturations were monitored continuously. The                            CF-HQ190L ([id number] Olympus colonoscope was                            introduced through the anus and advanced to the 10                            cm into the ileum. The colonoscopy was technically                            difficult and complex. Successful completion of the                            procedure was aided by performing the maneuvers                            documented (below) in this report. The patient                            tolerated the procedure. The quality of the bowel                            preparation was adequate. The terminal ileum,                            ileocecal valve, appendiceal orifice, and rectum                            were photographed. Scope In: 7:47:47 AM Scope Out: 9:03:22 AM Scope Withdrawal Time: 1 hour 10 minutes 20 seconds  Total Procedure Duration: 1 hour 15 minutes 35 seconds  Findings:      A moderate amount of semi-liquid stool was found in the entire colon,       interfering with visualization. Lavage of the area was performed using       copious amounts, resulting in clearance with adequate visualization.      The terminal ileum contained two sessile polyps at approximately 8  cm       into the TI. The polyps were small in diameter. These polyps were       removed with a cold snare. Resection and retrieval were complete.      A 20 mm polyp was found protruding from the ileocecal valve into the       cecum. The polyp was granular lateral spreading. Preparations were made       for mucosal resection. NBI and white light evaluation performed was done       to demarcate the borders of the lesion. I then proceeded with orise gel       injection to raise the lesion from the TI out into the cecum. Piecemeal       mucosal resection using a snare was performed. Resection and retrieval       were complete. To prevent  bleeding after mucosal resection, two       hemostatic clips were successfully placed (MR conditional) - though       these were only able to be placed within the cecal portion of the       lesion, the TI portion did not allow for appropriate placement for       zipper closure. There was no bleeding at the end of the procedure.      A tattoo was seen in the transverse colon.      Multiple small-mouthed diverticula were found in the recto-sigmoid colon       and sigmoid colon.      Non-bleeding non-thrombosed external and internal hemorrhoids were found       during retroflexion, during perianal exam and during digital exam. The       hemorrhoids were Grade II (internal hemorrhoids that prolapse but reduce       spontaneously). Impression:               - Stool in the entire examined colon. Lavaged with                            adequate visualization.                           - Two polyps in the terminal ileum, removed with a                            cold snare. Resected and retrieved.                           - One 20 mm polyp in the cecum protruding from the                            ileocecal valve into the cecum, removed with                            piecemeal mucosal resection. Resected and                            retrieved. Clips (MR conditional) were placed.                           - A tattoo was  seen in the transverse colon.                           - Diverticulosis in the recto-sigmoid colon and in                            the sigmoid colon.                           - Non-bleeding non-thrombosed external and internal                            hemorrhoids. Recommendation:           - The patient will be observed post-procedure,                            until all discharge criteria are met.                           - Discharge patient to home.                           - Patient has a contact number available for                            emergencies. The  signs and symptoms of potential                            delayed complications were discussed with the                            patient. Return to normal activities tomorrow.                            Written discharge instructions were provided to the                            patient.                           - Low fiber diet for 2 weeks.                           - Continue present medications.                           - No aspirin, ibuprofen, naproxen, or other                            non-steroidal anti-inflammatory drugs for 3 weeks.                           - Await pathology results.                           - Repeat colonoscopy in 6 months for  surveillance.                           - The findings and recommendations were discussed                            with the patient.                           - The findings and recommendations were discussed                            with the patient's family. Procedure Code(s):        --- Professional ---                           [phone], Colonoscopy, flexible; with endoscopic                            mucosal resection                           45385, 56, Colonoscopy, flexible; with removal of                            tumor(s), polyp(s), or other lesion(s) by snare                            technique Diagnosis Code(s):        --- Professional ---                           K64.1, Second degree hemorrhoids                           D13.39, Benign neoplasm of other parts of small                            intestine                           K63.5, Polyp of colon                           K57.30, Diverticulosis of large intestine without                            perforation or abscess without bleeding CPT copyright 2019 American Medical Association. All rights reserved. The codes documented in this report are preliminary and upon coder review may  be revised to meet current compliance requirements. Justice Britain,  MD 05/12/2020 9:28:28 AM Number of Addenda: 0

## 2020-05-12 NOTE — Anesthesia Postprocedure Evaluation (Signed)
Anesthesia Post Note  Patient: Johnny Vasquez  Procedure(s) Performed: COLONOSCOPY WITH PROPOFOL (N/A ) ENDOSCOPIC MUCOSAL RESECTION (N/A ) POLYPECTOMY SUBMUCOSAL LIFTING INJECTION HEMOSTASIS CLIP PLACEMENT     Patient location during evaluation: Endoscopy Anesthesia Type: MAC Level of consciousness: awake and alert Pain management: pain level controlled Vital Signs Assessment: post-procedure vital signs reviewed and stable Respiratory status: spontaneous breathing, nonlabored ventilation, respiratory function stable and patient connected to nasal cannula oxygen Cardiovascular status: stable and blood pressure returned to baseline Postop Assessment: no apparent nausea or vomiting Anesthetic complications: no   No complications documented.  Last Vitals:  Vitals:   05/12/20 0935 05/12/20 0940  BP: 105/81 114/70  Pulse: 62 62  Resp: 13 14  Temp:  (!) 36.2 C  SpO2: 99% 100%    Last Pain:  Vitals:   05/12/20 0940  TempSrc:   PainSc: 0-No pain                 Gilma Bessette COKER

## 2020-05-12 NOTE — H&P (Signed)
GASTROENTEROLOGY PROCEDURE H&P NOTE   Primary Care Physician: Maryella Shivers, MD  HPI: Johnny Vasquez is a 69 y.o. male who presents for Colonoscopy with EMR attempt of SSP of the ICV/Cecum.  Past Medical History:  Diagnosis Date  . Allergy   . Arthritis   . Asthma   . Colon polyps   . Depression    PTSD  . Diabetes mellitus without complication ([place])   . Diverticulitis   . Dysrhythmia    irregular heart rate  . GERD (gastroesophageal reflux disease)   . Glaucoma   . Gout   . Hepatitis    pt doesn't remember what type  . Hyperlipidemia   . Hypertension   . Pneumonia   . Sleep apnea    uses CPAP   Past Surgical History:  Procedure Laterality Date  . BACK SURGERY     x2  . COLONOSCOPY  05/27/2016  . KNEE ARTHROSCOPY Bilateral   . SHOULDER ARTHROSCOPY    . TIBIA FRACTURE SURGERY    . VASECTOMY     Current Facility-Administered Medications  Medication Dose Route Frequency Provider Last Rate Last Admin  . 0.9 %  sodium chloride infusion   Intravenous Continuous Mansouraty, Telford Nab., MD      . lactated ringers infusion   Intravenous Continuous Mansouraty, Telford Nab., MD 10 mL/hr at 05/12/20 0653 1,000 mL at 05/12/20 0653   Allergies  Allergen Reactions  . Aspirin Other (See Comments)    GI upset    Family History  Problem Relation Age of Onset  . Stomach cancer Maternal Aunt   . Stomach cancer Maternal Aunt   . Colon cancer Neg Hx   . Colon polyps Neg Hx   . Esophageal cancer Neg Hx   . Inflammatory bowel disease Neg Hx   . Liver disease Neg Hx   . Rectal cancer Neg Hx   . Pancreatic cancer Neg Hx    Social History   Socioeconomic History  . Marital status: Married    Spouse name: Not on file  . Number of children: Not on file  . Years of education: Not on file  . Highest education level: Not on file  Occupational History  . Not on file  Tobacco Use  . Smoking status: Never Smoker  . Smokeless tobacco: Never Used  Vaping Use  . Vaping  Use: Never used  Substance and Sexual Activity  . Alcohol use: Not Currently  . Drug use: Not Currently  . Sexual activity: Not on file  Other Topics Concern  . Not on file  Social History Narrative  . Not on file   Social Determinants of Health   Financial Resource Strain:   . Difficulty of Paying Living Expenses:   Food Insecurity:   . Worried About Charity fundraiser in the Last Year:   . Arboriculturist in the Last Year:   Transportation Needs:   . Film/video editor (Medical):   Marland Kitchen Lack of Transportation (Non-Medical):   Physical Activity:   . Days of Exercise per Week:   . Minutes of Exercise per Session:   Stress:   . Feeling of Stress :   Social Connections:   . Frequency of Communication with Friends and Family:   . Frequency of Social Gatherings with Friends and Family:   . Attends Religious Services:   . Active Member of Clubs or Organizations:   . Attends Archivist Meetings:   Marland Kitchen Marital Status:  Intimate Partner Violence:   . Fear of Current or Ex-Partner:   . Emotionally Abused:   Marland Kitchen Physically Abused:   . Sexually Abused:     Physical Exam: Vital signs in last 24 hours: Temp:  [97.8 F (36.6 C)] 97.8 F (36.6 C) (08/02 0642) Pulse Rate:  [81] 81 (08/02 0642) Resp:  [19] 19 (08/02 0642) BP: (147)/(90) 147/90 (08/02 0642) SpO2:  [96 %] 96 % (08/02 0642) Weight:  [98.4 kg] 98.4 kg (08/02 0645)   GEN: NAD EYE: Sclerae anicteric ENT: MMM CV: Non-tachycardic GI: Soft, rounded, protuberant NT/ND NEURO:  Alert & Oriented x 3  Lab Results: No results for input(s): WBC, HGB, HCT, PLT in the last 72 hours. BMET No results for input(s): NA, K, CL, CO2, GLUCOSE, BUN, CREATININE, CALCIUM in the last 72 hours. LFT No results for input(s): PROT, ALBUMIN, AST, ALT, ALKPHOS, BILITOT, BILIDIR, IBILI in the last 72 hours. PT/INR No results for input(s): LABPROT, INR in the last 72 hours.   Impression / Plan: This is a 69 y.o.male who  presents for Colonoscopy with EMR attempt of SSP of the ICV/Cecum.  The risks and benefits of endoscopic evaluation were discussed with the patient; these include but are not limited to the risk of perforation, infection, bleeding, missed lesions, lack of diagnosis, severe illness requiring hospitalization, as well as anesthesia and sedation related illnesses.  The patient is agreeable to proceed.    Justice Britain, MD [facility] Office # [phone]

## 2020-05-12 NOTE — Anesthesia Procedure Notes (Signed)
Procedure Name: MAC Date/Time: 05/12/2020 7:39 AM Performed by: Janace Litten, CRNA Pre-anesthesia Checklist: Patient identified, Emergency Drugs available, Suction available and Patient being monitored Patient Re-evaluated:Patient Re-evaluated prior to induction Oxygen Delivery Method: Simple face mask

## 2020-05-12 NOTE — Transfer of Care (Signed)
Immediate Anesthesia Transfer of Care Note  Patient: Johnny Vasquez  Procedure(s) Performed: COLONOSCOPY WITH PROPOFOL (N/A ) ENDOSCOPIC MUCOSAL RESECTION (N/A ) POLYPECTOMY SUBMUCOSAL LIFTING INJECTION HEMOSTASIS CLIP PLACEMENT  Patient Location: PACU  Anesthesia Type:MAC  Level of Consciousness: drowsy and responds to stimulation  Airway & Oxygen Therapy: Patient Spontanous Breathing and Patient connected to face mask oxygen  Post-op Assessment: Report given to RN and Post -op Vital signs reviewed and stable  Post vital signs: Reviewed and stable  Last Vitals:  Vitals Value Taken Time  BP 114/81 05/12/20 0910  Temp    Pulse 64 05/12/20 0910  Resp 10 05/12/20 0910  SpO2 97 % 05/12/20 0910  Vitals shown include unvalidated device data.  Last Pain:  Vitals:   05/12/20 0642  TempSrc: Oral         Complications: No complications documented.

## 2020-05-12 NOTE — Anesthesia Preprocedure Evaluation (Signed)
Anesthesia Evaluation  Patient identified by MRN, date of birth, ID band Patient awake    Reviewed: Allergy & Precautions, NPO status , Patient's Chart, lab work & pertinent test results  Airway Mallampati: II  TM Distance: >3 FB Neck ROM: Full    Dental  (+) Edentulous Upper, Dental Advisory Given, Partial Lower,    Pulmonary    breath sounds clear to auscultation       Cardiovascular hypertension,  Rhythm:Regular Rate:Normal     Neuro/Psych    GI/Hepatic   Endo/Other  diabetes  Renal/GU      Musculoskeletal   Abdominal   Peds  Hematology   Anesthesia Other Findings   Reproductive/Obstetrics                             Anesthesia Physical Anesthesia Plan  ASA: III  Anesthesia Plan: MAC   Post-op Pain Management:    Induction: Intravenous  PONV Risk Score and Plan: Ondansetron and Propofol infusion  Airway Management Planned: Natural Airway and Simple Face Mask  Additional Equipment:   Intra-op Plan:   Post-operative Plan:   Informed Consent: I have reviewed the patients History and Physical, chart, labs and discussed the procedure including the risks, benefits and alternatives for the proposed anesthesia with the patient or authorized representative who has indicated his/her understanding and acceptance.     Dental advisory given  Plan Discussed with: CRNA and Anesthesiologist  Anesthesia Plan Comments:         Anesthesia Quick Evaluation

## 2020-05-13 ENCOUNTER — Encounter (HOSPITAL_COMMUNITY): Payer: Self-pay | Admitting: Gastroenterology

## 2020-05-13 LAB — SURGICAL PATHOLOGY

## 2020-06-02 ENCOUNTER — Telehealth: Payer: Self-pay | Admitting: Gastroenterology

## 2020-06-02 NOTE — Telephone Encounter (Signed)
Spoke with patient, pt reports lower abdominal pain (left and right) - describes as dull and achy for about 2 weeks. Pt states that he has tried Copywriter, advertising with no relief, pt states Omeprazole does not help.Pt reports diarrhea about 2-3 times week. Pt reports nausea, no vomiting. Please advise, thank you

## 2020-06-02 NOTE — Telephone Encounter (Signed)
Pt is requesting a call back from a nurse, the pt had a procedure colonoscopy done on 8/2. Pt is experiencing diarrhea and needs to know if he should be concerned.

## 2020-06-03 ENCOUNTER — Other Ambulatory Visit: Payer: Self-pay

## 2020-06-03 ENCOUNTER — Encounter: Payer: Self-pay | Admitting: Gastroenterology

## 2020-06-03 DIAGNOSIS — R109 Unspecified abdominal pain: Secondary | ICD-10-CM

## 2020-06-03 DIAGNOSIS — R197 Diarrhea, unspecified: Secondary | ICD-10-CM

## 2020-06-03 NOTE — Telephone Encounter (Signed)
Spoke with patient and expressed that we would like to rule out any infections or common pathologies. Pt aware that he needs to go by the lab for stool studies and lab work. Advised patient of lab hours, aware that no appt is necessary, he is aware that he will receive the specimen container to lave the stool sample and if unable he can take it home and bring it back once he is able to complete it. Advised that stool studies take a little time for results to come back but we would give him further recommendations once his labs and stool studies return. Pt is aware that if labs are negative and he is still having symptoms we may proceed with a CT scan just to rule out any other issues. Lab order in epic.

## 2020-06-03 NOTE — Telephone Encounter (Signed)
Many thanks for your help. Happy to see him in the office for further evaluation if this testing is negative and his symptoms persist.

## 2020-06-03 NOTE — Telephone Encounter (Signed)
It would be very rare for the resection that we did to be the cause of his symptoms at this time. If he is having diarrhea, then we should at least rule out common pathologies and infections. If his issues persist then certainly we should consider cross-sectional imaging and ensure that nothing else is occurring though I think the likelihood is low. Lets proceed with scheduling stool studies for stool culture, C. difficile, ova and parasite. Lets proceed with some basic labs including a CBC/CMP/ESR/CRP. If these are negative and patient continues to have issues then would recommend likely a CT abdomen/pelvis with IV and oral contrast thereafter. I am forwarding this message to Dr. Tarri Glenn his primary gastroenterologist as well. Thanks. GM

## 2020-06-04 DIAGNOSIS — Z6834 Body mass index (BMI) 34.0-34.9, adult: Secondary | ICD-10-CM | POA: Diagnosis not present

## 2020-06-04 DIAGNOSIS — K529 Noninfective gastroenteritis and colitis, unspecified: Secondary | ICD-10-CM | POA: Diagnosis not present

## 2020-06-04 DIAGNOSIS — M79673 Pain in unspecified foot: Secondary | ICD-10-CM | POA: Diagnosis not present

## 2020-06-04 DIAGNOSIS — G8929 Other chronic pain: Secondary | ICD-10-CM | POA: Diagnosis not present

## 2020-06-05 ENCOUNTER — Other Ambulatory Visit (HOSPITAL_COMMUNITY)
Admission: RE | Admit: 2020-06-05 | Discharge: 2020-06-05 | Disposition: A | Discharge status: Home / Self Care | Payer: Medicare Other | Source: Other Acute Inpatient Hospital

## 2020-06-05 ENCOUNTER — Telehealth: Payer: Self-pay | Admitting: Gastroenterology

## 2020-06-05 ENCOUNTER — Other Ambulatory Visit (INDEPENDENT_AMBULATORY_CARE_PROVIDER_SITE_OTHER): Payer: Medicare Other

## 2020-06-05 DIAGNOSIS — R197 Diarrhea, unspecified: Secondary | ICD-10-CM

## 2020-06-05 DIAGNOSIS — R109 Unspecified abdominal pain: Secondary | ICD-10-CM

## 2020-06-05 LAB — COMPREHENSIVE METABOLIC PANEL
ALT: 12 U/L (ref 0–53)
AST: 16 U/L (ref 0–37)
Albumin: 4.4 g/dL (ref 3.5–5.2)
Alkaline Phosphatase: 71 U/L (ref 39–117)
BUN: 17 mg/dL (ref 6–23)
CO2: 28 mEq/L (ref 19–32)
Calcium: 9.8 mg/dL (ref 8.4–10.5)
Chloride: 104 mEq/L (ref 96–112)
Creatinine, Ser: 1.2 mg/dL (ref 0.40–1.50)
GFR: 72.1 mL/min (ref >60.00)
Glucose, Bld: 133 mg/dL — ABNORMAL HIGH (ref 70–99)
Potassium: 3.8 mEq/L (ref 3.5–5.1)
Sodium: 139 mEq/L (ref 135–145)
Total Bilirubin: 0.6 mg/dL (ref 0.2–1.2)
Total Protein: 7.4 g/dL (ref 6.0–8.3)

## 2020-06-05 LAB — SEDIMENTATION RATE: Sed Rate: 11 mm/hr (ref 0–20)

## 2020-06-05 LAB — CBC
HCT: 42.6 % (ref 39.0–52.0)
Hemoglobin: 14.4 g/dL (ref 13.0–17.0)
MCHC: 33.7 g/dL (ref 30.0–36.0)
MCV: 86.8 fl (ref 78.0–100.0)
Platelets: 190 10*3/uL (ref 150.0–400.0)
RBC: 4.91 Mil/uL (ref 4.22–5.81)
RDW: 14.1 % (ref 11.5–15.5)
WBC: 5.6 10*3/uL (ref 4.0–10.5)

## 2020-06-05 LAB — C-REACTIVE PROTEIN: CRP: <1 mg/dL (ref 0.5–20.0)

## 2020-06-05 NOTE — Telephone Encounter (Signed)
Johnny Vasquez from the Main lab at [facility] states she is not able to release the orders for blood work since they are not outpatient. [name] would like a call back from a nurse.  CB [phone]

## 2020-06-05 NOTE — Telephone Encounter (Signed)
Spoke with Johnny Vasquez from [facility] lab, pt arrived at this location for his labs - he was supposed to return to the lab in the basement of our office building. Kim expressed concerns about patient - she states that he didn't know what doctor ordered the labs or what he was getting labs for, she asked if it was through [facility] and he did not know.  I expressed that I had spoken with patient about 2 days ago - advised that his daughter is not on the chart that I can speak too, no spouse listed. She wanted to know if we gave patients hard copies of the address where they should go for the labs - I stated that we do if it is after an office visit, this patient called for advise and was provided this information during the phone call and unfortunately he does not use My Chart, advised that there was nothing else that I could really do at this time. She stated that she would go speak with the patient and tell him to come to our office for labs.

## 2020-06-06 ENCOUNTER — Other Ambulatory Visit: Payer: Medicare Other

## 2020-06-06 DIAGNOSIS — R109 Unspecified abdominal pain: Secondary | ICD-10-CM

## 2020-06-06 DIAGNOSIS — K529 Noninfective gastroenteritis and colitis, unspecified: Secondary | ICD-10-CM | POA: Diagnosis not present

## 2020-06-06 DIAGNOSIS — R197 Diarrhea, unspecified: Secondary | ICD-10-CM | POA: Diagnosis not present

## 2020-06-06 DIAGNOSIS — K5190 Ulcerative colitis, unspecified, without complications: Secondary | ICD-10-CM | POA: Diagnosis not present

## 2020-06-08 LAB — CLOSTRIDIUM DIFFICILE BY PCR: Toxigenic C. Difficile by PCR: NEGATIVE

## 2020-06-10 DIAGNOSIS — E785 Hyperlipidemia, unspecified: Secondary | ICD-10-CM | POA: Diagnosis not present

## 2020-06-10 DIAGNOSIS — E1169 Type 2 diabetes mellitus with other specified complication: Secondary | ICD-10-CM | POA: Diagnosis not present

## 2020-06-10 DIAGNOSIS — E782 Mixed hyperlipidemia: Secondary | ICD-10-CM | POA: Diagnosis not present

## 2020-06-10 LAB — SALMONELLA/SHIGELLA CULT, CAMPY EIA AND SHIGA TOXIN RFL ECOLI
MICRO NUMBER: 10881880
MICRO NUMBER:: 10881881
MICRO NUMBER:: 10881883
Result:: NOT DETECTED
SHIGA RESULT:: NOT DETECTED
SPECIMEN QUALITY: ADEQUATE
SPECIMEN QUALITY:: ADEQUATE
SPECIMEN QUALITY:: ADEQUATE

## 2020-06-10 LAB — OVA AND PARASITE EXAMINATION
CONCENTRATE RESULT:: NONE SEEN
MICRO NUMBER:: 10881882
SPECIMEN QUALITY:: ADEQUATE
TRICHROME RESULT:: NONE SEEN

## 2020-06-13 DIAGNOSIS — E1169 Type 2 diabetes mellitus with other specified complication: Secondary | ICD-10-CM | POA: Diagnosis not present

## 2020-06-17 ENCOUNTER — Other Ambulatory Visit: Payer: Self-pay | Admitting: Sports Medicine

## 2020-06-17 ENCOUNTER — Other Ambulatory Visit: Payer: Self-pay

## 2020-06-17 ENCOUNTER — Ambulatory Visit (INDEPENDENT_AMBULATORY_CARE_PROVIDER_SITE_OTHER): Payer: Medicare Other | Admitting: Sports Medicine

## 2020-06-17 ENCOUNTER — Encounter: Payer: Self-pay | Admitting: Sports Medicine

## 2020-06-17 ENCOUNTER — Ambulatory Visit (INDEPENDENT_AMBULATORY_CARE_PROVIDER_SITE_OTHER): Payer: Medicare Other

## 2020-06-17 DIAGNOSIS — M79672 Pain in left foot: Secondary | ICD-10-CM

## 2020-06-17 DIAGNOSIS — E119 Type 2 diabetes mellitus without complications: Secondary | ICD-10-CM

## 2020-06-17 DIAGNOSIS — M7742 Metatarsalgia, left foot: Secondary | ICD-10-CM | POA: Diagnosis not present

## 2020-06-17 DIAGNOSIS — M778 Other enthesopathies, not elsewhere classified: Secondary | ICD-10-CM

## 2020-06-17 DIAGNOSIS — M19072 Primary osteoarthritis, left ankle and foot: Secondary | ICD-10-CM | POA: Diagnosis not present

## 2020-06-17 DIAGNOSIS — M779 Enthesopathy, unspecified: Secondary | ICD-10-CM

## 2020-06-17 MED ORDER — MELOXICAM 15 MG PO TABS: 15.0000 mg | ORAL_TABLET | Freq: Every day | ORAL | 0 refills | Status: DC

## 2020-06-17 NOTE — Progress Notes (Signed)
Subjective: Johnny Vasquez is a 69 y.o. male patient who presents to office for evaluation of left foot and the ball.  Patient reports that there has been pain off and on for the last 3 years at a piece of glass in the area at one time that he got out himself but otherwise denies any redness swelling warmth drainage reports that the pain is better with tennis shoes and is worse with his dress shoes.  Patient also has a history of diabetes with last blood sugar recorded on yesterday of 116.  Last A1c 7.2.  And last visit to PCP Dr. Nyra Capes was last Thursday. Denies injury/trip/fall/sprain/any causative factors.   Review of systems noncontributory  There are no problems to display for this patient.   Current Outpatient Medications on File Prior to Visit  Medication Sig Dispense Refill  . allopurinol (ZYLOPRIM) 300 MG tablet Take 300 mg by mouth daily.    Marland Kitchen amLODipine (NORVASC) 5 MG tablet Take 5 mg by mouth daily.    Marland Kitchen aspirin EC 81 MG tablet Take 81 mg by mouth 3 (three) times a week. Swallow whole.     . brimonidine (ALPHAGAN) 0.2 % ophthalmic solution Place 1 drop into both eyes 3 (three) times daily.     . carboxymethylcellulose (REFRESH PLUS) 0.5 % SOLN Place 1 drop into both eyes 3 (three) times daily as needed (dry eyes).     . Cholecalciferol (VITAMIN D) 50 MCG (2000 UT) tablet Take 2,000 Units by mouth daily.    Marland Kitchen gabapentin (NEURONTIN) 300 MG capsule Take 300 mg by mouth at bedtime.     Marland Kitchen latanoprost (XALATAN) 0.005 % ophthalmic solution Place 1 drop into both eyes at bedtime.     Marland Kitchen loratadine (CLARITIN) 10 MG tablet Take 10 mg by mouth daily as needed (allergies).     . losartan (COZAAR) 100 MG tablet Take 100 mg by mouth daily.    . metFORMIN (GLUCOPHAGE-XR) 500 MG 24 hr tablet Take 500 mg by mouth daily with breakfast.     . omeprazole (PRILOSEC) 20 MG capsule Take 20 mg by mouth at bedtime.     . pravastatin (PRAVACHOL) 40 MG tablet Take 40 mg by mouth daily.    Marland Kitchen PROAIR HFA 108 (90  Base) MCG/ACT inhaler Inhale 2 puffs into the lungs every 6 (six) hours as needed for wheezing or shortness of breath.     Marland Kitchen SILDENAFIL CITRATE PO Take 100 mg by mouth daily as needed (ED).     Marland Kitchen SIMBRINZA 1-0.2 % SUSP     . tadalafil (CIALIS) 10 MG tablet Take 10 mg by mouth as directed.    . tamsulosin (FLOMAX) 0.4 MG CAPS capsule Take 0.8 mg by mouth at bedtime.      No current facility-administered medications on file prior to visit.    Allergies  Allergen Reactions  . Aspirin Other (See Comments)    GI upset     Objective:  General: Alert and oriented x3 in no acute distress  Dermatology: No open lesions bilateral lower extremities, no webspace macerations, no ecchymosis bilateral, all nails x 10 are well manicured.  Vascular: Dorsalis Pedis and Posterior Tibial pedal pulses palpable, Capillary Fill Time 3 seconds,(+) pedal hair growth bilateral, no edema bilateral lower extremities, Temperature gradient within normal limits.  Neurology: Johney Maine sensation intact via light touch bilateral.   Musculoskeletal: Mild tenderness with palpation at ball of the left foot submet 2 greater than 3 there is also distal fat pad  displacement at the left foot with hammertoe and hallux rigidus with limited range of motion at the first MPJ on the left.  Pes planus foot type.  Strength within normal limits in all groups bilateral.   Gait: Mildly antalgic gait  Xrays  Left foot   Impression: Joint space narrowing first MPJ and plantarflexed metatarsal heads with questionable bone spur at the second versus third metatarsal head plantarly, midtarsal breech supportive of pes planus deformity.  Assessment and Plan: Problem List Items Addressed This Visit    None    Visit Diagnoses    Capsulitis    -  Primary   Arthritis of left foot       Relevant Medications   meloxicam (MOBIC) 15 MG tablet   Metatarsalgia of left foot       Diabetes mellitus without complication (HCC)           -Complete  examination performed -Xrays reviewed -Discussed treatement options for likely capsulitis and arthritis -Patient declined injection at this time -Prescribed meloxicam to take as instructed -Dispensed metatarsal padding to patient to wear when in shoes to provide additional relief -Patient to return to office in 3 to 4 weeks or sooner if condition worsens.  Pain is no better patient may benefit from injection, cam boot, or diabetic shoes.  Landis Martins, DPM

## 2020-06-20 DIAGNOSIS — N183 Chronic kidney disease, stage 3 unspecified: Secondary | ICD-10-CM | POA: Diagnosis not present

## 2020-06-20 DIAGNOSIS — Z23 Encounter for immunization: Secondary | ICD-10-CM | POA: Diagnosis not present

## 2020-06-20 DIAGNOSIS — E1169 Type 2 diabetes mellitus with other specified complication: Secondary | ICD-10-CM | POA: Diagnosis not present

## 2020-06-20 DIAGNOSIS — I10 Essential (primary) hypertension: Secondary | ICD-10-CM | POA: Diagnosis not present

## 2020-06-20 DIAGNOSIS — E782 Mixed hyperlipidemia: Secondary | ICD-10-CM | POA: Diagnosis not present

## 2020-06-25 ENCOUNTER — Other Ambulatory Visit: Payer: Self-pay | Admitting: Sports Medicine

## 2020-06-25 DIAGNOSIS — M779 Enthesopathy, unspecified: Secondary | ICD-10-CM

## 2020-07-11 DIAGNOSIS — I10 Essential (primary) hypertension: Secondary | ICD-10-CM | POA: Diagnosis not present

## 2020-07-11 DIAGNOSIS — E782 Mixed hyperlipidemia: Secondary | ICD-10-CM | POA: Diagnosis not present

## 2020-07-11 DIAGNOSIS — E1169 Type 2 diabetes mellitus with other specified complication: Secondary | ICD-10-CM | POA: Diagnosis not present

## 2020-07-11 DIAGNOSIS — N183 Chronic kidney disease, stage 3 unspecified: Secondary | ICD-10-CM | POA: Diagnosis not present

## 2020-07-15 ENCOUNTER — Ambulatory Visit (INDEPENDENT_AMBULATORY_CARE_PROVIDER_SITE_OTHER): Payer: Medicare Other

## 2020-07-15 ENCOUNTER — Ambulatory Visit (INDEPENDENT_AMBULATORY_CARE_PROVIDER_SITE_OTHER): Payer: Medicare Other | Admitting: Sports Medicine

## 2020-07-15 ENCOUNTER — Encounter: Payer: Self-pay | Admitting: Sports Medicine

## 2020-07-15 ENCOUNTER — Other Ambulatory Visit: Payer: Self-pay

## 2020-07-15 ENCOUNTER — Other Ambulatory Visit: Payer: Self-pay | Admitting: Sports Medicine

## 2020-07-15 DIAGNOSIS — M21621 Bunionette of right foot: Secondary | ICD-10-CM

## 2020-07-15 DIAGNOSIS — M19071 Primary osteoarthritis, right ankle and foot: Secondary | ICD-10-CM

## 2020-07-15 DIAGNOSIS — M1990 Unspecified osteoarthritis, unspecified site: Secondary | ICD-10-CM

## 2020-07-15 DIAGNOSIS — E119 Type 2 diabetes mellitus without complications: Secondary | ICD-10-CM | POA: Diagnosis not present

## 2020-07-15 DIAGNOSIS — M779 Enthesopathy, unspecified: Secondary | ICD-10-CM

## 2020-07-15 NOTE — Progress Notes (Signed)
Subjective: Johnny Vasquez is a 69 y.o. male patient who returns to office for follow-up evaluation of foot pain.  Patient reports that his left foot is doing great no longer has pain but now over the last 2 weeks has developed a pain at the site of the right foot at the baby toe joint, patient reports the pain comes and goes if he stepped the wrong way he feels a sharp pain around the toe joint but otherwise has not noticed any other symptoms.  No other pedal complaints noted.  Fasting blood sugar not recorded  There are no problems to display for this patient.   Current Outpatient Medications on File Prior to Visit  Medication Sig Dispense Refill  . allopurinol (ZYLOPRIM) 300 MG tablet Take 300 mg by mouth daily.    Marland Kitchen amLODipine (NORVASC) 5 MG tablet Take 5 mg by mouth daily.    Marland Kitchen aspirin EC 81 MG tablet Take 81 mg by mouth 3 (three) times a week. Swallow whole.     . brimonidine (ALPHAGAN) 0.2 % ophthalmic solution Place 1 drop into both eyes 3 (three) times daily.     . carboxymethylcellulose (REFRESH PLUS) 0.5 % SOLN Place 1 drop into both eyes 3 (three) times daily as needed (dry eyes).     . Cholecalciferol (VITAMIN D) 50 MCG (2000 UT) tablet Take 2,000 Units by mouth daily.    Marland Kitchen gabapentin (NEURONTIN) 300 MG capsule Take 300 mg by mouth at bedtime.     Marland Kitchen latanoprost (XALATAN) 0.005 % ophthalmic solution Place 1 drop into both eyes at bedtime.     Marland Kitchen loratadine (CLARITIN) 10 MG tablet Take 10 mg by mouth daily as needed (allergies).     . losartan (COZAAR) 100 MG tablet Take 100 mg by mouth daily.    . meloxicam (MOBIC) 15 MG tablet Take 1 tablet (15 mg total) by mouth daily. 30 tablet 0  . metFORMIN (GLUCOPHAGE-XR) 500 MG 24 hr tablet Take 500 mg by mouth daily with breakfast.     . omeprazole (PRILOSEC) 20 MG capsule Take 20 mg by mouth at bedtime.     . pravastatin (PRAVACHOL) 40 MG tablet Take 40 mg by mouth daily.    Marland Kitchen PROAIR HFA 108 (90 Base) MCG/ACT inhaler Inhale 2 puffs into the  lungs every 6 (six) hours as needed for wheezing or shortness of breath.     Marland Kitchen SILDENAFIL CITRATE PO Take 100 mg by mouth daily as needed (ED).     Marland Kitchen SIMBRINZA 1-0.2 % SUSP     . tadalafil (CIALIS) 10 MG tablet Take 10 mg by mouth as directed.    . tamsulosin (FLOMAX) 0.4 MG CAPS capsule Take 0.8 mg by mouth at bedtime.      No current facility-administered medications on file prior to visit.    Allergies  Allergen Reactions  . Aspirin Other (See Comments)    GI upset     Objective:  General: Alert and oriented x3 in no acute distress  Dermatology: No open lesions bilateral lower extremities, no webspace macerations, no ecchymosis bilateral, mild dry skin noted plantar surfaces of bilateral feet, all nails x 10 are well manicured.  Vascular: Dorsalis Pedis and Posterior Tibial pedal pulses palpable, Capillary Fill Time 3 seconds,(+) pedal hair growth bilateral, no edema bilateral lower extremities, Temperature gradient within normal limits.  Neurology: Johney Maine sensation intact via light touch bilateral.   Musculoskeletal: Mild tenderness fifth metatarsophalangeal joint with minimal/early tailor's bunion deformity.  No reproducible pain  to palpation to left foot.  Pes planus foot type.  Strength within normal limits in all groups bilateral.   Xrays  Right foot   Impression: Joint space narrowing first MPJ and plantarflexed metatarsal heads with signs of arthritis diffusely, mild increase in intermetatarsal angle of the fifth toe supportive of early tailor bunion deformity, midtarsal breech supportive of pes planus deformity.  Ankle hardware intact.  Assessment and Plan: Problem List Items Addressed This Visit    None    Visit Diagnoses    Capsulitis    -  Primary   Relevant Orders   DG Foot Complete Right   Tailor's bunionette, right       Arthritis of right foot       Diabetes mellitus without complication (HCC)           -Complete examination performed -Xrays  reviewed -Discussed treatement options for likely capsulitis and arthritis with tailor's bunionette right fifth toe joint -Dispensed tailors bunion padding and advised patient to wear good supportive shoes to provide additional relief -Advised patient if pain fails to continue to improve may benefit from steroid injection -Continue with meloxicam until completed -Patient to return to office if fails to continue to improve after 2 weeks.  Landis Martins, DPM

## 2020-08-11 DIAGNOSIS — E782 Mixed hyperlipidemia: Secondary | ICD-10-CM | POA: Diagnosis not present

## 2020-08-11 DIAGNOSIS — E1169 Type 2 diabetes mellitus with other specified complication: Secondary | ICD-10-CM | POA: Diagnosis not present

## 2020-08-11 DIAGNOSIS — I10 Essential (primary) hypertension: Secondary | ICD-10-CM | POA: Diagnosis not present

## 2020-08-11 DIAGNOSIS — N183 Chronic kidney disease, stage 3 unspecified: Secondary | ICD-10-CM | POA: Diagnosis not present

## 2020-09-10 DIAGNOSIS — I10 Essential (primary) hypertension: Secondary | ICD-10-CM | POA: Diagnosis not present

## 2020-09-10 DIAGNOSIS — E782 Mixed hyperlipidemia: Secondary | ICD-10-CM | POA: Diagnosis not present

## 2020-09-10 DIAGNOSIS — N183 Chronic kidney disease, stage 3 unspecified: Secondary | ICD-10-CM | POA: Diagnosis not present

## 2020-09-10 DIAGNOSIS — E1169 Type 2 diabetes mellitus with other specified complication: Secondary | ICD-10-CM | POA: Diagnosis not present

## 2020-10-15 DIAGNOSIS — E1169 Type 2 diabetes mellitus with other specified complication: Secondary | ICD-10-CM | POA: Diagnosis not present

## 2020-10-22 DIAGNOSIS — Z139 Encounter for screening, unspecified: Secondary | ICD-10-CM | POA: Diagnosis not present

## 2020-10-22 DIAGNOSIS — Z1331 Encounter for screening for depression: Secondary | ICD-10-CM | POA: Diagnosis not present

## 2020-10-22 DIAGNOSIS — E785 Hyperlipidemia, unspecified: Secondary | ICD-10-CM | POA: Diagnosis not present

## 2020-10-22 DIAGNOSIS — I10 Essential (primary) hypertension: Secondary | ICD-10-CM | POA: Diagnosis not present

## 2020-10-22 DIAGNOSIS — N183 Chronic kidney disease, stage 3 unspecified: Secondary | ICD-10-CM | POA: Diagnosis not present

## 2020-10-22 DIAGNOSIS — E1140 Type 2 diabetes mellitus with diabetic neuropathy, unspecified: Secondary | ICD-10-CM | POA: Diagnosis not present

## 2020-10-22 DIAGNOSIS — Z136 Encounter for screening for cardiovascular disorders: Secondary | ICD-10-CM | POA: Diagnosis not present

## 2020-10-22 DIAGNOSIS — Z1339 Encounter for screening examination for other mental health and behavioral disorders: Secondary | ICD-10-CM | POA: Diagnosis not present

## 2020-10-22 DIAGNOSIS — Z0000 Encounter for general adult medical examination without abnormal findings: Secondary | ICD-10-CM | POA: Diagnosis not present

## 2020-11-11 DIAGNOSIS — I10 Essential (primary) hypertension: Secondary | ICD-10-CM | POA: Diagnosis not present

## 2020-11-11 DIAGNOSIS — E785 Hyperlipidemia, unspecified: Secondary | ICD-10-CM | POA: Diagnosis not present

## 2020-11-11 DIAGNOSIS — N183 Chronic kidney disease, stage 3 unspecified: Secondary | ICD-10-CM | POA: Diagnosis not present

## 2020-11-11 DIAGNOSIS — E1140 Type 2 diabetes mellitus with diabetic neuropathy, unspecified: Secondary | ICD-10-CM | POA: Diagnosis not present

## 2020-12-09 DIAGNOSIS — I10 Essential (primary) hypertension: Secondary | ICD-10-CM | POA: Diagnosis not present

## 2020-12-09 DIAGNOSIS — E1140 Type 2 diabetes mellitus with diabetic neuropathy, unspecified: Secondary | ICD-10-CM | POA: Diagnosis not present

## 2020-12-09 DIAGNOSIS — N183 Chronic kidney disease, stage 3 unspecified: Secondary | ICD-10-CM | POA: Diagnosis not present

## 2020-12-09 DIAGNOSIS — E785 Hyperlipidemia, unspecified: Secondary | ICD-10-CM | POA: Diagnosis not present

## 2021-01-09 DIAGNOSIS — E1140 Type 2 diabetes mellitus with diabetic neuropathy, unspecified: Secondary | ICD-10-CM | POA: Diagnosis not present

## 2021-01-09 DIAGNOSIS — I10 Essential (primary) hypertension: Secondary | ICD-10-CM | POA: Diagnosis not present

## 2021-01-09 DIAGNOSIS — I129 Hypertensive chronic kidney disease with stage 1 through stage 4 chronic kidney disease, or unspecified chronic kidney disease: Secondary | ICD-10-CM | POA: Diagnosis not present

## 2021-01-09 DIAGNOSIS — N183 Chronic kidney disease, stage 3 unspecified: Secondary | ICD-10-CM | POA: Diagnosis not present

## 2021-02-08 DIAGNOSIS — I129 Hypertensive chronic kidney disease with stage 1 through stage 4 chronic kidney disease, or unspecified chronic kidney disease: Secondary | ICD-10-CM | POA: Diagnosis not present

## 2021-02-08 DIAGNOSIS — E1169 Type 2 diabetes mellitus with other specified complication: Secondary | ICD-10-CM | POA: Diagnosis not present

## 2021-02-08 DIAGNOSIS — E1140 Type 2 diabetes mellitus with diabetic neuropathy, unspecified: Secondary | ICD-10-CM | POA: Diagnosis not present

## 2021-02-19 DIAGNOSIS — Z125 Encounter for screening for malignant neoplasm of prostate: Secondary | ICD-10-CM | POA: Diagnosis not present

## 2021-02-19 DIAGNOSIS — E1169 Type 2 diabetes mellitus with other specified complication: Secondary | ICD-10-CM | POA: Diagnosis not present

## 2021-03-02 DIAGNOSIS — E782 Mixed hyperlipidemia: Secondary | ICD-10-CM | POA: Diagnosis not present

## 2021-03-02 DIAGNOSIS — E1169 Type 2 diabetes mellitus with other specified complication: Secondary | ICD-10-CM | POA: Diagnosis not present

## 2021-03-02 DIAGNOSIS — I10 Essential (primary) hypertension: Secondary | ICD-10-CM | POA: Diagnosis not present

## 2021-03-02 DIAGNOSIS — N183 Chronic kidney disease, stage 3 unspecified: Secondary | ICD-10-CM | POA: Diagnosis not present

## 2021-03-11 DIAGNOSIS — I10 Essential (primary) hypertension: Secondary | ICD-10-CM | POA: Diagnosis not present

## 2021-03-11 DIAGNOSIS — E1169 Type 2 diabetes mellitus with other specified complication: Secondary | ICD-10-CM | POA: Diagnosis not present

## 2021-03-11 DIAGNOSIS — E782 Mixed hyperlipidemia: Secondary | ICD-10-CM | POA: Diagnosis not present

## 2021-04-10 DIAGNOSIS — E1169 Type 2 diabetes mellitus with other specified complication: Secondary | ICD-10-CM | POA: Diagnosis not present

## 2021-04-10 DIAGNOSIS — E782 Mixed hyperlipidemia: Secondary | ICD-10-CM | POA: Diagnosis not present

## 2021-04-10 DIAGNOSIS — I10 Essential (primary) hypertension: Secondary | ICD-10-CM | POA: Diagnosis not present

## 2021-04-19 DIAGNOSIS — R0602 Shortness of breath: Secondary | ICD-10-CM | POA: Diagnosis not present

## 2021-04-19 DIAGNOSIS — I10 Essential (primary) hypertension: Secondary | ICD-10-CM | POA: Diagnosis not present

## 2021-04-19 DIAGNOSIS — R0789 Other chest pain: Secondary | ICD-10-CM | POA: Diagnosis not present

## 2021-04-19 DIAGNOSIS — K219 Gastro-esophageal reflux disease without esophagitis: Secondary | ICD-10-CM | POA: Diagnosis not present

## 2021-04-19 DIAGNOSIS — E785 Hyperlipidemia, unspecified: Secondary | ICD-10-CM | POA: Diagnosis not present

## 2021-04-19 DIAGNOSIS — J45901 Unspecified asthma with (acute) exacerbation: Secondary | ICD-10-CM | POA: Diagnosis not present

## 2021-04-19 DIAGNOSIS — E119 Type 2 diabetes mellitus without complications: Secondary | ICD-10-CM | POA: Diagnosis not present

## 2021-04-19 DIAGNOSIS — I4891 Unspecified atrial fibrillation: Secondary | ICD-10-CM | POA: Diagnosis not present

## 2021-04-19 DIAGNOSIS — Z79899 Other long term (current) drug therapy: Secondary | ICD-10-CM | POA: Diagnosis not present

## 2021-04-19 DIAGNOSIS — Z7984 Long term (current) use of oral hypoglycemic drugs: Secondary | ICD-10-CM | POA: Diagnosis not present

## 2021-04-23 DIAGNOSIS — Z6833 Body mass index (BMI) 33.0-33.9, adult: Secondary | ICD-10-CM | POA: Diagnosis not present

## 2021-04-23 DIAGNOSIS — R0982 Postnasal drip: Secondary | ICD-10-CM | POA: Diagnosis not present

## 2021-04-23 DIAGNOSIS — Z7689 Persons encountering health services in other specified circumstances: Secondary | ICD-10-CM | POA: Diagnosis not present

## 2021-04-29 DIAGNOSIS — N390 Urinary tract infection, site not specified: Secondary | ICD-10-CM | POA: Diagnosis not present

## 2021-04-29 DIAGNOSIS — K219 Gastro-esophageal reflux disease without esophagitis: Secondary | ICD-10-CM | POA: Diagnosis not present

## 2021-04-29 DIAGNOSIS — J45909 Unspecified asthma, uncomplicated: Secondary | ICD-10-CM | POA: Diagnosis not present

## 2021-04-29 DIAGNOSIS — M1990 Unspecified osteoarthritis, unspecified site: Secondary | ICD-10-CM | POA: Diagnosis not present

## 2021-04-29 DIAGNOSIS — Z79899 Other long term (current) drug therapy: Secondary | ICD-10-CM | POA: Diagnosis not present

## 2021-04-29 DIAGNOSIS — I10 Essential (primary) hypertension: Secondary | ICD-10-CM | POA: Diagnosis not present

## 2021-04-29 DIAGNOSIS — I4891 Unspecified atrial fibrillation: Secondary | ICD-10-CM | POA: Diagnosis not present

## 2021-04-29 DIAGNOSIS — N3001 Acute cystitis with hematuria: Secondary | ICD-10-CM | POA: Diagnosis not present

## 2021-04-29 DIAGNOSIS — E119 Type 2 diabetes mellitus without complications: Secondary | ICD-10-CM | POA: Diagnosis not present

## 2021-04-29 DIAGNOSIS — Z7984 Long term (current) use of oral hypoglycemic drugs: Secondary | ICD-10-CM | POA: Diagnosis not present

## 2021-05-01 DIAGNOSIS — Z6833 Body mass index (BMI) 33.0-33.9, adult: Secondary | ICD-10-CM | POA: Diagnosis not present

## 2021-05-01 DIAGNOSIS — N390 Urinary tract infection, site not specified: Secondary | ICD-10-CM | POA: Diagnosis not present

## 2021-05-11 DIAGNOSIS — E1169 Type 2 diabetes mellitus with other specified complication: Secondary | ICD-10-CM | POA: Diagnosis not present

## 2021-05-11 DIAGNOSIS — I10 Essential (primary) hypertension: Secondary | ICD-10-CM | POA: Diagnosis not present

## 2021-05-11 DIAGNOSIS — E782 Mixed hyperlipidemia: Secondary | ICD-10-CM | POA: Diagnosis not present

## 2021-05-28 ENCOUNTER — Other Ambulatory Visit: Payer: Self-pay

## 2021-05-28 ENCOUNTER — Telehealth: Payer: Self-pay

## 2021-05-28 DIAGNOSIS — Z86010 Personal history of colonic polyps: Secondary | ICD-10-CM

## 2021-05-28 DIAGNOSIS — K6389 Other specified diseases of intestine: Secondary | ICD-10-CM

## 2021-05-28 NOTE — Telephone Encounter (Signed)
Any medical history changes since last OV? No   Any medication changes since last OV?  No   Any  type of blood thinner? No   Do you use oxygen at any time? No   Patient has been scheduled at [facility] hospital on 06/18/21 for a Colonoscopy.  Prep if needed has been sent to their pharmacy. They are asked to arrive at 10:45am for a 12:15pm procedure.  Instructions reviewed with the patient and mailed to their home.  They will call back with any questions once they are received.

## 2021-06-11 DIAGNOSIS — E782 Mixed hyperlipidemia: Secondary | ICD-10-CM | POA: Diagnosis not present

## 2021-06-11 DIAGNOSIS — I10 Essential (primary) hypertension: Secondary | ICD-10-CM | POA: Diagnosis not present

## 2021-06-11 DIAGNOSIS — E1169 Type 2 diabetes mellitus with other specified complication: Secondary | ICD-10-CM | POA: Diagnosis not present

## 2021-06-17 NOTE — Progress Notes (Signed)
Spoke with pt gave pre procedure instructions. Instructed not to take metformin day of procedure 06/18/2021

## 2021-06-18 ENCOUNTER — Encounter (HOSPITAL_COMMUNITY)
Admission: RE | Disposition: A | Discharge status: Home / Self Care | Payer: Self-pay | Source: Home / Self Care | Attending: Gastroenterology

## 2021-06-18 ENCOUNTER — Ambulatory Visit (HOSPITAL_COMMUNITY): Payer: Medicare Other | Admitting: Anesthesiology

## 2021-06-18 ENCOUNTER — Encounter (HOSPITAL_COMMUNITY): Payer: Self-pay | Admitting: Gastroenterology

## 2021-06-18 ENCOUNTER — Other Ambulatory Visit: Payer: Self-pay

## 2021-06-18 ENCOUNTER — Ambulatory Visit (HOSPITAL_COMMUNITY)
Admission: RE | Admit: 2021-06-18 | Discharge: 2021-06-18 | Disposition: A | Discharge status: Home / Self Care | Payer: Medicare Other | Attending: Gastroenterology | Admitting: Gastroenterology

## 2021-06-18 DIAGNOSIS — Z09 Encounter for follow-up examination after completed treatment for conditions other than malignant neoplasm: Secondary | ICD-10-CM | POA: Insufficient documentation

## 2021-06-18 DIAGNOSIS — K641 Second degree hemorrhoids: Secondary | ICD-10-CM | POA: Diagnosis not present

## 2021-06-18 DIAGNOSIS — K529 Noninfective gastroenteritis and colitis, unspecified: Secondary | ICD-10-CM | POA: Diagnosis not present

## 2021-06-18 DIAGNOSIS — Z886 Allergy status to analgesic agent status: Secondary | ICD-10-CM | POA: Diagnosis not present

## 2021-06-18 DIAGNOSIS — I10 Essential (primary) hypertension: Secondary | ICD-10-CM | POA: Diagnosis not present

## 2021-06-18 DIAGNOSIS — K6389 Other specified diseases of intestine: Secondary | ICD-10-CM | POA: Diagnosis not present

## 2021-06-18 DIAGNOSIS — Z86010 Personal history of colonic polyps: Secondary | ICD-10-CM | POA: Insufficient documentation

## 2021-06-18 DIAGNOSIS — K5730 Diverticulosis of large intestine without perforation or abscess without bleeding: Secondary | ICD-10-CM | POA: Diagnosis not present

## 2021-06-18 DIAGNOSIS — K635 Polyp of colon: Secondary | ICD-10-CM | POA: Diagnosis not present

## 2021-06-18 HISTORY — PX: POLYPECTOMY: SHX5525

## 2021-06-18 HISTORY — PX: COLONOSCOPY WITH PROPOFOL: SHX5780

## 2021-06-18 SURGERY — COLONOSCOPY WITH PROPOFOL
Anesthesia: Monitor Anesthesia Care

## 2021-06-18 MED ORDER — LIDOCAINE 2% (20 MG/ML) 5 ML SYRINGE
INTRAMUSCULAR | Status: DC | PRN
  Administered 2021-06-18: 100 mg via INTRAVENOUS

## 2021-06-18 MED ORDER — PROPOFOL 500 MG/50ML IV EMUL
INTRAVENOUS | Status: DC | PRN
  Administered 2021-06-18: 125 ug/kg/min via INTRAVENOUS

## 2021-06-18 MED ORDER — LACTATED RINGERS IV SOLN: INTRAVENOUS | Status: DC | PRN

## 2021-06-18 MED ORDER — PROPOFOL 10 MG/ML IV BOLUS
INTRAVENOUS | Status: DC | PRN
Start: 2021-06-18 — End: 2021-06-18
  Administered 2021-06-18: 30 mg via INTRAVENOUS

## 2021-06-18 MED ORDER — SODIUM CHLORIDE 0.9 % IV SOLN: INTRAVENOUS | Status: DC

## 2021-06-18 SURGICAL SUPPLY — 21 items

## 2021-06-18 NOTE — Transfer of Care (Signed)
Immediate Anesthesia Transfer of Care Note  Patient: Johnny Vasquez  Procedure(s) Performed: COLONOSCOPY WITH PROPOFOL POLYPECTOMY  Patient Location: Endoscopy Unit  Anesthesia Type:MAC  Level of Consciousness: awake, alert  and oriented  Airway & Oxygen Therapy: Patient Spontanous Breathing and Patient connected to face mask oxygen  Post-op Assessment: Report given to RN and Post -op Vital signs reviewed and stable  Post vital signs: Reviewed and stable  Last Vitals:  Vitals Value Taken Time  BP 102/59 06/18/21 1326  Temp 36.2 C 06/18/21 1326  Pulse 59 06/18/21 1332  Resp 7 06/18/21 1332  SpO2 97 % 06/18/21 1332  Vitals shown include unvalidated device data.  Last Pain:  Vitals:   06/18/21 1326  TempSrc: Temporal  PainSc: 0-No pain         Complications: No notable events documented.

## 2021-06-18 NOTE — Op Note (Signed)
[facility] Patient Name: Johnny Vasquez Procedure Date : 06/18/2021 MRN: [id number] Attending MD: Justice Britain , MD Date of Birth: Sep 25, 1951 CSN: [id number] Age: 70 Admit Type: Outpatient Procedure:                Colonoscopy Indications:              High risk colon cancer surveillance: Personal                            history of colonic polyps (prior ICV polyp that had                            been biopsied and returned as adenoma but then EMR                            in 2021 did not show any adenomatous tissue - here                            for follow up) Providers:                Justice Britain, MD, Burtis Junes, RN, Cletis Athens,                            Technician Referring MD:             Maryella Shivers, Thornton Park MD, MD Medicines:                Monitored Anesthesia Care Complications:            No immediate complications. Estimated Blood Loss:     Estimated blood loss was minimal. Procedure:                Pre-Anesthesia Assessment:                           - Prior to the procedure, a History and Physical                            was performed, and patient medications and                            allergies were reviewed. The patient's tolerance of                            previous anesthesia was also reviewed. The risks                            and benefits of the procedure and the sedation                            options and risks were discussed with the patient.                            All questions were answered, and informed consent  was obtained. Prior Anticoagulants: The patient has                            taken no previous anticoagulant or antiplatelet                            agents except for NSAID medication. ASA Grade                            Assessment: III - A patient with severe systemic                            disease. After reviewing the risks and benefits,                             the patient was deemed in satisfactory condition to                            undergo the procedure.                           After obtaining informed consent, the colonoscope                            was passed under direct vision. Throughout the                            procedure, the patient's blood pressure, pulse, and                            oxygen saturations were monitored continuously. The                            CF-HQ190L ([id number]) Olympus coloscope was                            introduced through the anus and advanced to the 10                            cm into the ileum. The colonoscopy was performed                            without difficulty. The patient tolerated the                            procedure. The quality of the bowel preparation was                            good. The terminal ileum, ileocecal valve,                            appendiceal orifice, and rectum were photographed. Scope In: 1:02:14 PM Scope Out: 1:18:52 PM Scope Withdrawal Time: 0 hours 12 minutes 52 seconds  Total Procedure Duration:  0 hours 16 minutes 38 seconds  Findings:      The digital rectal exam findings include hemorrhoids. Pertinent       negatives include no palpable rectal lesions.      The terminal ileum appeared normal.      A medium post mucosectomy scar was found ileocecal valve. There was       residual polypoid tissue. The polyp was removed with a cold snare.       Resection and retrieval were complete.      Multiple small-mouthed diverticula were found in the recto-sigmoid       colon, sigmoid colon and descending colon.      Normal mucosa was found in the entire colon otherwise.      Non-bleeding non-thrombosed internal hemorrhoids were found during       retroflexion, during perianal exam and during digital exam. The       hemorrhoids were Grade II (internal hemorrhoids that prolapse but reduce       spontaneously). Impression:               -  Hemorrhoids found on digital rectal exam.                           - The examined portion of the ileum was normal.                           - Scar at the ileocecal valve with some small                            polypoid appearance - this was removed to evaluate                            if any adenomatous tissue.                           - Diverticulosis in the recto-sigmoid colon, in the                            sigmoid colon and in the descending colon.                           - Normal mucosa in the entire examined colon                            otherwise.                           - Non-bleeding non-thrombosed internal hemorrhoids. Recommendation:           - The patient will be observed post-procedure,                            until all discharge criteria are met.                           - Discharge patient to home.                           -  Patient has a contact number available for                            emergencies. The signs and symptoms of potential                            delayed complications were discussed with the                            patient. Return to normal activities tomorrow.                            Written discharge instructions were provided to the                            patient.                           - High fiber diet.                           - Use FiberCon 1-2 tablets PO daily.                           - Continue present medications.                           - Await pathology results.                           - Repeat colonoscopy in 1-year if any adenomatous                            tissue is found otherwise 3 years for surveillance.                           - The findings and recommendations were discussed                            with the patient.                           - The findings and recommendations were discussed                            with the patient's family. Procedure Code(s):        ---  Professional ---                           [phone], Colonoscopy, flexible; with removal of                            tumor(s), polyp(s), or other lesion(s) by snare                            technique Diagnosis Code(s):        ---  Professional ---                           Z86.010, Personal history of colonic polyps                           K64.1, Second degree hemorrhoids                           K63.89, Other specified diseases of intestine                           K57.30, Diverticulosis of large intestine without                            perforation or abscess without bleeding CPT copyright 2019 [employer]. All rights reserved. The codes documented in this report are preliminary and upon coder review may  be revised to meet current compliance requirements. Justice Britain, MD 06/18/2021 1:39:43 PM Number of Addenda: 0

## 2021-06-18 NOTE — Anesthesia Preprocedure Evaluation (Addendum)
Anesthesia Evaluation  Patient identified by MRN, date of birth, ID band Patient awake    Reviewed: Allergy & Precautions, NPO status , Patient's Chart, lab work & pertinent test results  History of Anesthesia Complications Negative for: history of anesthetic complications  Airway Mallampati: II  TM Distance: >3 FB Neck ROM: Full    Dental  (+) Dental Advisory Given, Upper Dentures, Partial Lower   Pulmonary asthma , sleep apnea and Continuous Positive Airway Pressure Ventilation ,    Pulmonary exam normal        Cardiovascular hypertension, Pt. on medications Normal cardiovascular exam+ dysrhythmias      Neuro/Psych PSYCHIATRIC DISORDERS Depression negative neurological ROS     GI/Hepatic GERD  Medicated and Controlled,(+) Hepatitis -  Endo/Other  diabetes, Type 2, Oral Hypoglycemic Agents  Renal/GU negative Renal ROS     Musculoskeletal  (+) Arthritis ,  Gout    Abdominal   Peds  Hematology negative hematology ROS (+)   Anesthesia Other Findings   Reproductive/Obstetrics                            Anesthesia Physical Anesthesia Plan  ASA: 3  Anesthesia Plan: MAC   Post-op Pain Management:    Induction:   PONV Risk Score and Plan: 1 and Propofol infusion and Treatment may vary due to age or medical condition  Airway Management Planned: Nasal Cannula and Natural Airway  Additional Equipment: None  Intra-op Plan:   Post-operative Plan:   Informed Consent: I have reviewed the patients History and Physical, chart, labs and discussed the procedure including the risks, benefits and alternatives for the proposed anesthesia with the patient or authorized representative who has indicated his/her understanding and acceptance.       Plan Discussed with: CRNA and Anesthesiologist  Anesthesia Plan Comments:        Anesthesia Quick Evaluation

## 2021-06-18 NOTE — H&P (Signed)
GASTROENTEROLOGY PROCEDURE H&P NOTE   Primary Care Physician: Maryella Shivers, MD  HPI: Johnny Vasquez is a 70 y.o. male who presents for follow up of ICV polyp that on first biopsy returned as adenomatous, but on large EMR in 2021 did not show evidence of adenoma.  Here to ensure no recurrence of adenomatous tissue before going back to normal surveillance.  Past Medical History:  Diagnosis Date   Allergy    Arthritis    Asthma    Colon polyps    Depression    PTSD   Diabetes mellitus without complication ([place])    Diverticulitis    Dysrhythmia    irregular heart rate   GERD (gastroesophageal reflux disease)    Glaucoma    Gout    Hepatitis    pt doesn't remember what type   Hyperlipidemia    Hypertension    Pneumonia    Sleep apnea    uses CPAP   Past Surgical History:  Procedure Laterality Date   BACK SURGERY     x2   COLONOSCOPY  05/27/2016   COLONOSCOPY WITH PROPOFOL N/A 05/12/2020   Procedure: COLONOSCOPY WITH PROPOFOL;  Surgeon: Irving Copas., MD;  Location: [facility] ENDOSCOPY;  Service: Gastroenterology;  Laterality: N/A;   ENDOSCOPIC MUCOSAL RESECTION N/A 05/12/2020   Procedure: ENDOSCOPIC MUCOSAL RESECTION;  Surgeon: Rush Landmark Telford Nab., MD;  Location: [place];  Service: Gastroenterology;  Laterality: N/A;   HEMOSTASIS CLIP PLACEMENT  05/12/2020   Procedure: HEMOSTASIS CLIP PLACEMENT;  Surgeon: Irving Copas., MD;  Location: [facility] ENDOSCOPY;  Service: Gastroenterology;;   KNEE ARTHROSCOPY Bilateral    POLYPECTOMY  05/12/2020   Procedure: POLYPECTOMY;  Surgeon: Irving Copas., MD;  Location: [facility] ENDOSCOPY;  Service: Gastroenterology;;   SHOULDER ARTHROSCOPY     SUBMUCOSAL LIFTING INJECTION  05/12/2020   Procedure: SUBMUCOSAL LIFTING INJECTION;  Surgeon: Irving Copas., MD;  Location: [facility] ENDOSCOPY;  Service: Gastroenterology;;   TIBIA FRACTURE SURGERY     VASECTOMY     Current Facility-Administered Medications  Medication Dose  Route Frequency Provider Last Rate Last Admin   0.9 %  sodium chloride infusion   Intravenous Continuous Mansouraty, Telford Nab., MD        Current Facility-Administered Medications:    0.9 %  sodium chloride infusion, , Intravenous, Continuous, Mansouraty, Telford Nab., MD Allergies  Allergen Reactions   Aspirin Other (See Comments)    GI upset    Family History  Problem Relation Age of Onset   Stomach cancer Maternal Aunt    Stomach cancer Maternal Aunt    Colon cancer Neg Hx    Colon polyps Neg Hx    Esophageal cancer Neg Hx    Inflammatory bowel disease Neg Hx    Liver disease Neg Hx    Rectal cancer Neg Hx    Pancreatic cancer Neg Hx    Social History   Socioeconomic History   Marital status: Married    Spouse name: Not on file   Number of children: Not on file   Years of education: Not on file   Highest education level: Not on file  Occupational History   Not on file  Tobacco Use   Smoking status: Never   Smokeless tobacco: Never  Vaping Use   Vaping Use: Never used  Substance and Sexual Activity   Alcohol use: Not Currently   Drug use: Not Currently   Sexual activity: Not on file  Other Topics Concern   Not on file  Social  History Narrative   Not on file   Social Determinants of Health   Financial Resource Strain: Not on file  Food Insecurity: Not on file  Transportation Needs: Not on file  Physical Activity: Not on file  Stress: Not on file  Social Connections: Not on file  Intimate Partner Violence: Not on file    Physical Exam: Vital signs in last 24 hours: Temp:  [97.6 F (36.4 C)] 97.6 F (36.4 C) (09/08 1124) Pulse Rate:  [71] 71 (09/08 1124) Resp:  [14] 14 (09/08 1124) BP: (159)/(87) 159/87 (09/08 1124) SpO2:  [100 %] 100 % (09/08 1124) Weight:  [103.4 kg] 103.4 kg (09/08 1124)   GEN: NAD EYE: Sclerae anicteric ENT: MMM CV: Non-tachycardic GI: Soft, NT/ND NEURO:  Alert & Oriented x 3  Lab Results: No results for input(s): WBC,  HGB, HCT, PLT in the last 72 hours. BMET No results for input(s): NA, K, CL, CO2, GLUCOSE, BUN, CREATININE, CALCIUM in the last 72 hours. LFT No results for input(s): PROT, ALBUMIN, AST, ALT, ALKPHOS, BILITOT, BILIDIR, IBILI in the last 72 hours. PT/INR No results for input(s): LABPROT, INR in the last 72 hours.   Impression / Plan: This is a 71 y.o.male who presents for follow up of ICV polyp that on first biopsy returned as adenomatous, but on large EMR in 2021 did not show evidence of adenoma.  Here to ensure no recurrence of adenomatous tissue before going back to normal surveillance.  The risks and benefits of endoscopic evaluation/treatment were discussed with the patient and/or family; these include but are not limited to the risk of perforation, infection, bleeding, missed lesions, lack of diagnosis, severe illness requiring hospitalization, as well as anesthesia and sedation related illnesses.  The patient's history has been reviewed, patient examined, no change in status, and deemed stable for procedure.  The patient and/or family is agreeable to proceed.    Justice Britain, MD [facility] # [phone]

## 2021-06-19 ENCOUNTER — Encounter (HOSPITAL_COMMUNITY): Payer: Self-pay | Admitting: Gastroenterology

## 2021-06-19 ENCOUNTER — Other Ambulatory Visit: Payer: Self-pay

## 2021-06-19 ENCOUNTER — Emergency Department (HOSPITAL_COMMUNITY)
Admission: EM | Admit: 2021-06-19 | Discharge: 2021-06-19 | Disposition: A | Discharge status: Home / Self Care | Payer: Medicare Other | Attending: Emergency Medicine | Admitting: Emergency Medicine

## 2021-06-19 ENCOUNTER — Telehealth: Payer: Self-pay | Admitting: Gastroenterology

## 2021-06-19 ENCOUNTER — Encounter: Payer: Self-pay | Admitting: Gastroenterology

## 2021-06-19 DIAGNOSIS — K625 Hemorrhage of anus and rectum: Secondary | ICD-10-CM | POA: Diagnosis not present

## 2021-06-19 DIAGNOSIS — Z7984 Long term (current) use of oral hypoglycemic drugs: Secondary | ICD-10-CM | POA: Insufficient documentation

## 2021-06-19 DIAGNOSIS — K91840 Postprocedural hemorrhage and hematoma of a digestive system organ or structure following a digestive system procedure: Secondary | ICD-10-CM | POA: Diagnosis not present

## 2021-06-19 DIAGNOSIS — Y838 Other surgical procedures as the cause of abnormal reaction of the patient, or of later complication, without mention of misadventure at the time of the procedure: Secondary | ICD-10-CM | POA: Diagnosis not present

## 2021-06-19 DIAGNOSIS — E119 Type 2 diabetes mellitus without complications: Secondary | ICD-10-CM | POA: Diagnosis not present

## 2021-06-19 DIAGNOSIS — I10 Essential (primary) hypertension: Secondary | ICD-10-CM | POA: Diagnosis not present

## 2021-06-19 DIAGNOSIS — R1013 Epigastric pain: Secondary | ICD-10-CM | POA: Insufficient documentation

## 2021-06-19 DIAGNOSIS — J45909 Unspecified asthma, uncomplicated: Secondary | ICD-10-CM | POA: Diagnosis not present

## 2021-06-19 DIAGNOSIS — E669 Obesity, unspecified: Secondary | ICD-10-CM | POA: Insufficient documentation

## 2021-06-19 DIAGNOSIS — Z79899 Other long term (current) drug therapy: Secondary | ICD-10-CM | POA: Diagnosis not present

## 2021-06-19 LAB — CBC
HCT: 45.5 % (ref 39.0–52.0)
Hemoglobin: 15.3 g/dL (ref 13.0–17.0)
MCH: 29.5 pg (ref 26.0–34.0)
MCHC: 33.6 g/dL (ref 30.0–36.0)
MCV: 87.7 fL (ref 80.0–100.0)
Platelets: 195 10*3/uL (ref 150–400)
RBC: 5.19 MIL/uL (ref 4.22–5.81)
RDW: 12.6 % (ref 11.5–15.5)
WBC: 5.5 10*3/uL (ref 4.0–10.5)
nRBC: 0 % (ref 0.0–0.2)

## 2021-06-19 LAB — COMPREHENSIVE METABOLIC PANEL
ALT: 17 U/L (ref 0–44)
AST: 23 U/L (ref 15–41)
Albumin: 3.9 g/dL (ref 3.5–5.0)
Alkaline Phosphatase: 68 U/L (ref 38–126)
Anion gap: 6 (ref 5–15)
BUN: 16 mg/dL (ref 8–23)
CO2: 25 mmol/L (ref 22–32)
Calcium: 9.3 mg/dL (ref 8.9–10.3)
Chloride: 105 mmol/L (ref 98–111)
Creatinine, Ser: 1.24 mg/dL (ref 0.61–1.24)
GFR, Estimated: >60 mL/min (ref >60)
Glucose, Bld: 123 mg/dL — ABNORMAL HIGH (ref 70–99)
Potassium: 3.9 mmol/L (ref 3.5–5.1)
Sodium: 136 mmol/L (ref 135–145)
Total Bilirubin: 0.7 mg/dL (ref 0.3–1.2)
Total Protein: 6.9 g/dL (ref 6.5–8.1)

## 2021-06-19 LAB — TYPE AND SCREEN
ABO/RH(D): B POS
Antibody Screen: NEGATIVE

## 2021-06-19 LAB — POC OCCULT BLOOD, ED: Fecal Occult Bld: POSITIVE — AB

## 2021-06-19 LAB — SURGICAL PATHOLOGY

## 2021-06-19 NOTE — Telephone Encounter (Signed)
Thank you. GM 

## 2021-06-19 NOTE — Telephone Encounter (Signed)
Dr. Rush Landmark, I received a call from the ER PA-C who assessed the patient was hemodynamically stable. Hg 15.3. Rectal exam showed stool with a small amount of darker red blood. He did not actively pass any blood per the rectum while in the ED. No abdominal pain. No CP, SOB or palpitations. Dr. Lyndel Safe reviewed his colonoscopy report and I provided him with the above update and he felt the patient was stable for discharge and a formal GI consult was not required. He was in no distress when I saw him. Heart, lung and abdominal exam were normal.  He was discharged home with the instructions to call our on call service if he develops any further rectal bleeding over the weekend and to return to the ED if he has significant rectal bleeding. Patient was agreeable with plans to discharge home. He takes ASA '81mg'$  a few days weekly and I advised him not to take it for the next day or two.  [name], Pls call the patient on Monday 06/22/2021 to check on him and provide Dr .Rush Landmark with an update. I appreciate it.

## 2021-06-19 NOTE — Telephone Encounter (Signed)
Ok will take care of him

## 2021-06-19 NOTE — Telephone Encounter (Signed)
Johnny Vasquez, I am sorry to hear this. I agree with your assessment and recommendation. Hard to imaging the cold snare resections would be etiology for as much bleeding as you have described, but needs to be considered as well. Inpatient GI team, please be aware of this patient coming into ED. Thank you for taking care of him in advance. GM

## 2021-06-19 NOTE — Anesthesia Postprocedure Evaluation (Signed)
Anesthesia Post Note  Patient: Johnny Vasquez  Procedure(s) Performed: COLONOSCOPY WITH PROPOFOL POLYPECTOMY     Patient location during evaluation: PACU Anesthesia Type: MAC Level of consciousness: awake and alert Pain management: pain level controlled Vital Signs Assessment: post-procedure vital signs reviewed and stable Respiratory status: spontaneous breathing, nonlabored ventilation and respiratory function stable Cardiovascular status: stable and blood pressure returned to baseline Anesthetic complications: no   No notable events documented.  Last Vitals:  Vitals:   06/18/21 1335 06/18/21 1347  BP: 102/69 129/86  Pulse: (!) 56 60  Resp: (!) 8 17  Temp:    SpO2: 98% 99%    Last Pain:  Vitals:   06/18/21 1347  TempSrc:   PainSc: 0-No pain                 Audry Pili

## 2021-06-19 NOTE — ED Triage Notes (Signed)
Pt here for rectal bleeding that started last night after having a colonoscopy done in the afternoon. Reports dark red blood in stool w/ bowel movements.

## 2021-06-19 NOTE — ED Provider Notes (Signed)
[facility] EMERGENCY DEPARTMENT Provider Note   CSN: [id number] Arrival date & time: 06/19/21  1142     History Chief Complaint  Patient presents with   Rectal Bleeding    Johnny Vasquez is a 70 y.o. male who presents with concern for 3 large bloody bowel movements that were bright red blood and maroon-colored stool this morning, following colonoscopy yesterday with resection of ileocecal polyp.  Patient states he has had some very mild intermittent abdominal pain associated with this.  Has not had anything to eat or drink today.  States that he woke up this morning feeling did have a bowel movement and had subsequently a very large stool as described above.  I personally read this patient's medical records.  He did undergo colonoscopy yesterday with Dr. Rush Landmark , with [facility].  Patient with history of hypertension, asthma, arthritis, GERD, diabetes and depression.  He is not anticoagulated.  HPI     Past Medical History:  Diagnosis Date   Allergy    Arthritis    Asthma    Colon polyps    Depression    PTSD   Diabetes mellitus without complication ([place])    Diverticulitis    Dysrhythmia    irregular heart rate   GERD (gastroesophageal reflux disease)    Glaucoma    Gout    Hepatitis    pt doesn't remember what type   Hyperlipidemia    Hypertension    Pneumonia    Sleep apnea    uses CPAP    There are no problems to display for this patient.   Past Surgical History:  Procedure Laterality Date   BACK SURGERY     x2   COLONOSCOPY  05/27/2016   COLONOSCOPY WITH PROPOFOL N/A 05/12/2020   Procedure: COLONOSCOPY WITH PROPOFOL;  Surgeon: Rush Landmark Telford Nab., MD;  Location: [place];  Service: Gastroenterology;  Laterality: N/A;   COLONOSCOPY WITH PROPOFOL N/A 06/18/2021   Procedure: COLONOSCOPY WITH PROPOFOL;  Surgeon: Rush Landmark Telford Nab., MD;  Location: [place];  Service: Gastroenterology;  Laterality: N/A;   ENDOSCOPIC MUCOSAL  RESECTION N/A 05/12/2020   Procedure: ENDOSCOPIC MUCOSAL RESECTION;  Surgeon: Rush Landmark Telford Nab., MD;  Location: [place];  Service: Gastroenterology;  Laterality: N/A;   HEMOSTASIS CLIP PLACEMENT  05/12/2020   Procedure: HEMOSTASIS CLIP PLACEMENT;  Surgeon: Irving Copas., MD;  Location: [facility] ENDOSCOPY;  Service: Gastroenterology;;   KNEE ARTHROSCOPY Bilateral    POLYPECTOMY  05/12/2020   Procedure: POLYPECTOMY;  Surgeon: Irving Copas., MD;  Location: [place];  Service: Gastroenterology;;   POLYPECTOMY  06/18/2021   Procedure: POLYPECTOMY;  Surgeon: Irving Copas., MD;  [facility] ENDOSCOPY;  Service: Gastroenterology;;   SHOULDER ARTHROSCOPY     SUBMUCOSAL LIFTING INJECTION  05/12/2020   Procedure: SUBMUCOSAL LIFTING INJECTION;  Surgeon: Irving Copas., MD;  Location: [facility] ENDOSCOPY;  Service: Gastroenterology;;   TIBIA FRACTURE SURGERY     VASECTOMY         Family History  Problem Relation Age of Onset   Stomach cancer Maternal Aunt    Stomach cancer Maternal Aunt    Colon cancer Neg Hx    Colon polyps Neg Hx    Esophageal cancer Neg Hx    Inflammatory bowel disease Neg Hx    Liver disease Neg Hx    Rectal cancer Neg Hx    Pancreatic cancer Neg Hx     Social History   Tobacco Use   Smoking status: Never  Smokeless tobacco: Never  Vaping Use   Vaping Use: Never used  Substance Use Topics   Alcohol use: Not Currently   Drug use: Not Currently    Home Medications Prior to Admission medications   Medication Sig Start Date End Date Taking? Authorizing Provider  allopurinol (ZYLOPRIM) 300 MG tablet Take 300 mg by mouth daily as needed (gout). 10/29/19   [provider]  amLODipine (NORVASC) 5 MG tablet Take 5 mg by mouth daily. 10/29/19   [provider]  aspirin EC 81 MG tablet Take 81 mg by mouth every other day. Swallow whole.    [provider]  brimonidine (ALPHAGAN) 0.2 % ophthalmic solution Place 1  drop into both eyes 3 (three) times daily.     [provider]  carboxymethylcellulose (REFRESH PLUS) 0.5 % SOLN Place 1 drop into both eyes 3 (three) times daily as needed (dry eyes).     [provider]  Cholecalciferol (VITAMIN D) 50 MCG (2000 UT) tablet Take 2,000 Units by mouth daily. 10/29/19   [provider]  COMBIVENT RESPIMAT 20-100 MCG/ACT AERS respimat Inhale 1 puff into the lungs daily as needed for wheezing or shortness of breath. 06/01/21   [provider]  fluticasone (FLONASE) 50 MCG/ACT nasal spray Place 1 spray into both nostrils 2 (two) times daily as needed for allergies. 06/01/21   [provider]  gabapentin (NEURONTIN) 300 MG capsule Take 300 mg by mouth at bedtime as needed (pain). 10/29/19   [provider]  latanoprost (XALATAN) 0.005 % ophthalmic solution Place 1 drop into both eyes at bedtime.     [provider]  loratadine (CLARITIN) 10 MG tablet Take 10 mg by mouth daily as needed (allergies).     [provider]  losartan (COZAAR) 100 MG tablet Take 100 mg by mouth daily. 10/29/19   [provider]  meloxicam (MOBIC) 15 MG tablet Take 1 tablet (15 mg total) by mouth daily. Patient taking differently: Take 15 mg by mouth daily as needed for pain. 06/17/20   Landis Martins, DPM  metFORMIN (GLUCOPHAGE-XR) 500 MG 24 hr tablet Take 500 mg by mouth daily with breakfast.  10/29/19   [provider]  Omega-3 1000 MG CAPS Take 1,000 mg by mouth daily.    [provider]  omeprazole (PRILOSEC) 20 MG capsule Take 20 mg by mouth at bedtime.  10/29/19   [provider]  pravastatin (PRAVACHOL) 40 MG tablet Take 40 mg by mouth at bedtime. 10/29/19   [provider]  PROAIR HFA 108 (90 Base) MCG/ACT inhaler Inhale 2 puffs into the lungs every 6 (six) hours as needed for wheezing or shortness of breath.  10/29/19   [provider]  sildenafil (VIAGRA) 100 MG tablet Take  100 mg by mouth daily as needed for erectile dysfunction.    [provider]  SIMBRINZA 1-0.2 % SUSP Place 1 drop into both eyes in the morning and at bedtime. 05/27/20   [provider]  tamsulosin (FLOMAX) 0.4 MG CAPS capsule Take 0.8 mg by mouth at bedtime.     [provider]    Allergies    Aspirin  Review of Systems   Review of Systems  Constitutional: Negative.   HENT: Negative.    Respiratory: Negative.    Cardiovascular: Negative.   Gastrointestinal:  Positive for abdominal pain, anal bleeding, blood in stool and diarrhea. Negative for constipation, nausea, rectal pain and vomiting.  Genitourinary: Negative.   Musculoskeletal: Negative.  Skin: Negative.   Neurological: Negative.    Physical Exam Updated Vital Signs BP 130/80   Pulse 62   Temp 98.4 F (36.9 C) (Oral)   Resp 10   Ht '5\' 8"'$  (1.727 m)   Wt 99.8 kg   SpO2 99%   BMI 33.45 kg/m   Physical Exam Vitals and nursing note reviewed. Exam conducted with a chaperone present.  Constitutional:      Appearance: He is obese. He is not ill-appearing or toxic-appearing.  HENT:     Head: Normocephalic and atraumatic.     Nose: Nose normal. No congestion.     Mouth/Throat:     Mouth: Mucous membranes are moist.     Pharynx: Oropharynx is clear. Uvula midline. No oropharyngeal exudate or posterior oropharyngeal erythema.     Tonsils: No tonsillar exudate.  Eyes:     General: Lids are normal. Vision grossly intact.        Right eye: No discharge.        Left eye: No discharge.     Extraocular Movements: Extraocular movements intact.     Conjunctiva/sclera: Conjunctivae normal.     Pupils: Pupils are equal, round, and reactive to light.  Neck:     Trachea: Trachea and phonation normal.  Cardiovascular:     Rate and Rhythm: Normal rate and regular rhythm.     Pulses: Normal pulses.     Heart sounds: Normal heart sounds. No murmur heard. Pulmonary:     Effort: Pulmonary effort is  normal. No tachypnea, bradypnea, accessory muscle usage, prolonged expiration or respiratory distress.     Breath sounds: Normal breath sounds. No wheezing or rales.  Chest:     Chest wall: No mass, lacerations, deformity, swelling, tenderness, crepitus or edema.  Abdominal:     General: Abdomen is protuberant. Bowel sounds are normal. There is no distension.     Palpations: Abdomen is soft.     Tenderness: There is abdominal tenderness in the epigastric area. There is no right CVA tenderness, left CVA tenderness, guarding or rebound.     Comments: Mild epigastric pressure to palpation the patient states he feels it is just because he is hungry.  Genitourinary:    Rectum: Guaiac result positive. External hemorrhoid and internal hemorrhoid present. No tenderness.     Comments: Bright red blood and maroon-colored stool present on the glove of this provider following rectal exam. Musculoskeletal:        General: No deformity.     Cervical back: Normal range of motion and neck supple. No edema, rigidity or crepitus. No pain with movement, spinous process tenderness or muscular tenderness.     Right lower leg: No edema.     Left lower leg: No edema.  Lymphadenopathy:     Cervical: No cervical adenopathy.  Skin:    General: Skin is warm and dry.     Capillary Refill: Capillary refill takes less than 2 seconds.  Neurological:     General: No focal deficit present.     Mental Status: He is alert and oriented to person, place, and time. Mental status is at baseline.  Psychiatric:        Mood and Affect: Mood normal.    ED Results / Procedures / Treatments   Labs (all labs ordered are listed, but only abnormal results are displayed) Labs Reviewed  COMPREHENSIVE METABOLIC PANEL - Abnormal; Notable for the following components:      Result Value   Glucose, Bld 123 (*)  All other components within normal limits  POC OCCULT BLOOD, ED - Abnormal; Notable for the following components:   Fecal  Occult Bld POSITIVE (*)    All other components within normal limits  CBC  TYPE AND SCREEN  ABO/RH    EKG None  Radiology No results found.  Procedures Procedures   Medications Ordered in ED Medications - No data to display  ED Course  I have reviewed the triage vital signs and the nursing notes.  Pertinent labs & imaging results that were available during my care of the patient were reviewed by me and considered in my medical decision making (see chart for details).  Clinical Course as of 06/19/21 1657  Fri Jun 19, 2021  1510 Maroon colored blood on the gloved finger of this provider after rectal exam.  [RS]  1525 Consult to [facility] gastroenterology PA, Carl Best, who agrees that this is abnormal course following routine colonoscopy with single polyp resection.  Despite normal kidney function and hemoglobin, she does feel case warrants further discussion with GI attending, Dr. Lyndel Safe.  She will call me back with disposition plan after discussing with him.  I appreciate the collaboration in the care this patient. [RS]  [id number] Call back from GI APP, Colleen, who states patient is clear to be discharged from a GI perspective, given reassuring Hbg and VS throughout his stay, as well as lack of any further bloody output at home.  [RS]    Clinical Course User Index [RS] Farris Geiman, Gypsy Balsam, PA-C   MDM Rules/Calculators/A&P                         70 year old male who presents with concern for large amount of bloody bowel movements following colonoscopy yesterday.  Vital signs are normal on intake.  Cardiopulmonary exam is normal, abdominal exam with very mild epigastric pressure to palpation without true pain.  No CVA tenderness and no lower abdominal tenderness to palpation.  Patient is neurovascularly intact in all 4 extremities and is cognitively intact.  GU exam as above with bright red blood and maroon-colored stool present in the rectal vault.  CBC with hemoglobin  of 17.3, does not appear to be hemoconcentrated.  CMP with creatinine of 1.24, very slightly elevated from patient's baseline of 1.2.  Fecal occult test positive though unreliable given bright red blood in rectal vault.  Patient remains without episode of hematochezia throughout his stay in the emergency department and his vital signs remained normal.  Consult to GI as above, who recommends discharge home with close outpatient follow up. No further work up warranted in the ED at this time given reassuring laboratory studies and physical exam. Kathy voiced understanding of his medical evaluation and treatment plan.  Each of his questions was answered to his expressed satisfaction.  Strict return precautions were given.  Patient is well-appearing, stable, and appropriate for discharge at this time.  This chart was dictated using voice recognition software, Dragon. Despite the best efforts of this provider to proofread and correct errors, errors may still occur which can change documentation meaning.  Final Clinical Impression(s) / ED Diagnoses Final diagnoses:  Rectal bleeding    Rx / DC Orders ED Discharge Orders     None        Emeline Darling, PA-C 06/19/21 1657    Lajean Saver, MD 06/20/21 [phone]

## 2021-06-19 NOTE — Telephone Encounter (Signed)
The pt had colon yesterday with Dr Rush Landmark and states that he woke up in the middle of the night with rectal bleeding when passing stool.  He states that now he is passing dark red blood independent of stool about every 15 min and is very nauseous.  I have advised him to go to the ED for eval.  FYI Dr Rush Landmark.

## 2021-06-19 NOTE — Telephone Encounter (Signed)
Inbound call from patient stating he started bleeding in the middle of the night and is still currently bleeding, wants to know if that is normal.  Please advise.

## 2021-06-19 NOTE — Discharge Instructions (Addendum)
You were seen in the ER for your rectal bleeding, after your colonoscopy. Your blood work, physical exam, and vital signs were reassuring.  Your case was discussed with the gastroenterologist, who agrees that you are stable to be discharged home at this time.  Please follow-up next week for reevaluation of your blood work or sooner if you have any recurring symptoms.  Please follow up with your gastroenterologist closely in the outpatient setting and return to the ER with any recurrent rectal bleeding, abdominal pain, nausea or vomiting does not stop, fevers, chills, or any other new severe symptom.

## 2021-06-21 NOTE — Telephone Encounter (Addendum)
CKS, Thank you for update. [name], please check in and see how the patient has done on Monday. GM

## 2021-06-21 NOTE — Progress Notes (Signed)
Please enter into surveillance for colonoscopy in 3 years. Thanks.

## 2021-06-22 NOTE — Telephone Encounter (Signed)
The pt was called and states that he has had one episode of dark blood with BM.  He states that he feels fine and has no bleeding other than the dark blood on the stool.

## 2021-06-22 NOTE — Telephone Encounter (Signed)
Thank you for update. Let's touchbase with him on Wednesday. If any further bleeding, then will need to come in for CBC. Thanks. GM

## 2021-06-24 ENCOUNTER — Telehealth: Payer: Self-pay

## 2021-06-24 NOTE — Telephone Encounter (Signed)
-----   Message from Timothy Lasso, RN sent at 06/22/2021 10:13 AM EDT ----- Call pt on [facility]ED and get an update

## 2021-06-24 NOTE — Telephone Encounter (Signed)
Left message on machine to call back  

## 2021-06-24 NOTE — Telephone Encounter (Signed)
Thank you for update. GM 

## 2021-06-24 NOTE — Telephone Encounter (Signed)
The pt returned call and states he is doing very well and has no further bleeding.  He will call back if bleeding returns.

## 2021-07-11 DIAGNOSIS — I10 Essential (primary) hypertension: Secondary | ICD-10-CM | POA: Diagnosis not present

## 2021-07-11 DIAGNOSIS — E1169 Type 2 diabetes mellitus with other specified complication: Secondary | ICD-10-CM | POA: Diagnosis not present

## 2021-07-11 DIAGNOSIS — E782 Mixed hyperlipidemia: Secondary | ICD-10-CM | POA: Diagnosis not present

## 2021-07-21 DIAGNOSIS — M545 Low back pain, unspecified: Secondary | ICD-10-CM | POA: Diagnosis not present

## 2021-07-21 DIAGNOSIS — Z6834 Body mass index (BMI) 34.0-34.9, adult: Secondary | ICD-10-CM | POA: Diagnosis not present

## 2021-07-21 DIAGNOSIS — Z23 Encounter for immunization: Secondary | ICD-10-CM | POA: Diagnosis not present

## 2021-07-23 DIAGNOSIS — Z6834 Body mass index (BMI) 34.0-34.9, adult: Secondary | ICD-10-CM | POA: Diagnosis not present

## 2021-07-23 DIAGNOSIS — M545 Low back pain, unspecified: Secondary | ICD-10-CM | POA: Diagnosis not present

## 2021-08-04 DIAGNOSIS — Z6833 Body mass index (BMI) 33.0-33.9, adult: Secondary | ICD-10-CM | POA: Diagnosis not present

## 2021-08-04 DIAGNOSIS — M545 Low back pain, unspecified: Secondary | ICD-10-CM | POA: Diagnosis not present

## 2021-08-11 DIAGNOSIS — E782 Mixed hyperlipidemia: Secondary | ICD-10-CM | POA: Diagnosis not present

## 2021-08-11 DIAGNOSIS — E1169 Type 2 diabetes mellitus with other specified complication: Secondary | ICD-10-CM | POA: Diagnosis not present

## 2021-08-11 DIAGNOSIS — I10 Essential (primary) hypertension: Secondary | ICD-10-CM | POA: Diagnosis not present

## 2021-09-10 DIAGNOSIS — E1169 Type 2 diabetes mellitus with other specified complication: Secondary | ICD-10-CM | POA: Diagnosis not present

## 2021-09-10 DIAGNOSIS — I129 Hypertensive chronic kidney disease with stage 1 through stage 4 chronic kidney disease, or unspecified chronic kidney disease: Secondary | ICD-10-CM | POA: Diagnosis not present

## 2021-09-14 DIAGNOSIS — Z139 Encounter for screening, unspecified: Secondary | ICD-10-CM | POA: Diagnosis not present

## 2021-09-14 DIAGNOSIS — N400 Enlarged prostate without lower urinary tract symptoms: Secondary | ICD-10-CM | POA: Diagnosis not present

## 2021-09-14 DIAGNOSIS — Z6833 Body mass index (BMI) 33.0-33.9, adult: Secondary | ICD-10-CM | POA: Diagnosis not present

## 2021-09-15 DIAGNOSIS — E1169 Type 2 diabetes mellitus with other specified complication: Secondary | ICD-10-CM | POA: Diagnosis not present

## 2021-09-19 DIAGNOSIS — Z7982 Long term (current) use of aspirin: Secondary | ICD-10-CM | POA: Diagnosis not present

## 2021-09-19 DIAGNOSIS — Z20822 Contact with and (suspected) exposure to covid-19: Secondary | ICD-10-CM | POA: Diagnosis not present

## 2021-09-19 DIAGNOSIS — I10 Essential (primary) hypertension: Secondary | ICD-10-CM | POA: Diagnosis not present

## 2021-09-19 DIAGNOSIS — Z79899 Other long term (current) drug therapy: Secondary | ICD-10-CM | POA: Diagnosis not present

## 2021-09-19 DIAGNOSIS — I249 Acute ischemic heart disease, unspecified: Secondary | ICD-10-CM | POA: Diagnosis not present

## 2021-09-19 DIAGNOSIS — Z7984 Long term (current) use of oral hypoglycemic drugs: Secondary | ICD-10-CM | POA: Diagnosis not present

## 2021-09-19 DIAGNOSIS — E7800 Pure hypercholesterolemia, unspecified: Secondary | ICD-10-CM | POA: Diagnosis not present

## 2021-09-19 DIAGNOSIS — R079 Chest pain, unspecified: Secondary | ICD-10-CM | POA: Diagnosis not present

## 2021-09-19 DIAGNOSIS — E119 Type 2 diabetes mellitus without complications: Secondary | ICD-10-CM | POA: Diagnosis not present

## 2021-09-21 DIAGNOSIS — Z7689 Persons encountering health services in other specified circumstances: Secondary | ICD-10-CM | POA: Diagnosis not present

## 2021-09-21 DIAGNOSIS — I10 Essential (primary) hypertension: Secondary | ICD-10-CM | POA: Diagnosis not present

## 2021-09-21 DIAGNOSIS — E782 Mixed hyperlipidemia: Secondary | ICD-10-CM | POA: Diagnosis not present

## 2021-09-21 DIAGNOSIS — R079 Chest pain, unspecified: Secondary | ICD-10-CM | POA: Diagnosis not present

## 2021-09-21 DIAGNOSIS — E1169 Type 2 diabetes mellitus with other specified complication: Secondary | ICD-10-CM | POA: Diagnosis not present

## 2021-09-24 DIAGNOSIS — Q219 Congenital malformation of cardiac septum, unspecified: Secondary | ICD-10-CM | POA: Diagnosis not present

## 2021-09-24 DIAGNOSIS — R079 Chest pain, unspecified: Secondary | ICD-10-CM | POA: Diagnosis not present

## 2021-09-24 DIAGNOSIS — I4891 Unspecified atrial fibrillation: Secondary | ICD-10-CM | POA: Diagnosis not present

## 2021-10-11 DIAGNOSIS — E1140 Type 2 diabetes mellitus with diabetic neuropathy, unspecified: Secondary | ICD-10-CM | POA: Diagnosis not present

## 2021-10-11 DIAGNOSIS — E782 Mixed hyperlipidemia: Secondary | ICD-10-CM | POA: Diagnosis not present

## 2021-10-11 DIAGNOSIS — I10 Essential (primary) hypertension: Secondary | ICD-10-CM | POA: Diagnosis not present

## 2021-11-11 DIAGNOSIS — E782 Mixed hyperlipidemia: Secondary | ICD-10-CM | POA: Diagnosis not present

## 2021-11-11 DIAGNOSIS — I10 Essential (primary) hypertension: Secondary | ICD-10-CM | POA: Diagnosis not present

## 2021-11-11 DIAGNOSIS — E1140 Type 2 diabetes mellitus with diabetic neuropathy, unspecified: Secondary | ICD-10-CM | POA: Diagnosis not present

## 2021-12-09 DIAGNOSIS — I10 Essential (primary) hypertension: Secondary | ICD-10-CM | POA: Diagnosis not present

## 2021-12-09 DIAGNOSIS — E1140 Type 2 diabetes mellitus with diabetic neuropathy, unspecified: Secondary | ICD-10-CM | POA: Diagnosis not present

## 2021-12-09 DIAGNOSIS — E782 Mixed hyperlipidemia: Secondary | ICD-10-CM | POA: Diagnosis not present

## 2022-01-12 DIAGNOSIS — I10 Essential (primary) hypertension: Secondary | ICD-10-CM | POA: Diagnosis not present

## 2022-01-12 DIAGNOSIS — E1169 Type 2 diabetes mellitus with other specified complication: Secondary | ICD-10-CM | POA: Diagnosis not present

## 2022-01-12 DIAGNOSIS — Z125 Encounter for screening for malignant neoplasm of prostate: Secondary | ICD-10-CM | POA: Diagnosis not present

## 2022-01-18 DIAGNOSIS — I10 Essential (primary) hypertension: Secondary | ICD-10-CM | POA: Diagnosis not present

## 2022-01-18 DIAGNOSIS — Z789 Other specified health status: Secondary | ICD-10-CM | POA: Diagnosis not present

## 2022-01-18 DIAGNOSIS — Z1339 Encounter for screening examination for other mental health and behavioral disorders: Secondary | ICD-10-CM | POA: Diagnosis not present

## 2022-01-18 DIAGNOSIS — Z136 Encounter for screening for cardiovascular disorders: Secondary | ICD-10-CM | POA: Diagnosis not present

## 2022-01-18 DIAGNOSIS — Z139 Encounter for screening, unspecified: Secondary | ICD-10-CM | POA: Diagnosis not present

## 2022-01-18 DIAGNOSIS — Z0000 Encounter for general adult medical examination without abnormal findings: Secondary | ICD-10-CM | POA: Diagnosis not present

## 2022-01-18 DIAGNOSIS — E785 Hyperlipidemia, unspecified: Secondary | ICD-10-CM | POA: Diagnosis not present

## 2022-01-18 DIAGNOSIS — R9439 Abnormal result of other cardiovascular function study: Secondary | ICD-10-CM | POA: Diagnosis not present

## 2022-01-18 DIAGNOSIS — Z1331 Encounter for screening for depression: Secondary | ICD-10-CM | POA: Diagnosis not present

## 2022-01-18 DIAGNOSIS — E1140 Type 2 diabetes mellitus with diabetic neuropathy, unspecified: Secondary | ICD-10-CM | POA: Diagnosis not present

## 2022-01-18 DIAGNOSIS — Z1389 Encounter for screening for other disorder: Secondary | ICD-10-CM | POA: Diagnosis not present

## 2022-02-01 DIAGNOSIS — Z9181 History of falling: Secondary | ICD-10-CM | POA: Diagnosis not present

## 2022-02-01 DIAGNOSIS — Z6833 Body mass index (BMI) 33.0-33.9, adult: Secondary | ICD-10-CM | POA: Diagnosis not present

## 2022-02-01 DIAGNOSIS — R6889 Other general symptoms and signs: Secondary | ICD-10-CM | POA: Diagnosis not present

## 2022-02-04 ENCOUNTER — Encounter: Payer: Self-pay | Admitting: *Deleted

## 2022-02-04 ENCOUNTER — Encounter: Payer: Self-pay | Admitting: Cardiology

## 2022-02-04 DIAGNOSIS — Z860100 Personal history of colon polyps, unspecified: Secondary | ICD-10-CM

## 2022-02-04 DIAGNOSIS — I10 Essential (primary) hypertension: Secondary | ICD-10-CM | POA: Insufficient documentation

## 2022-02-04 DIAGNOSIS — M7711 Lateral epicondylitis, right elbow: Secondary | ICD-10-CM

## 2022-02-04 DIAGNOSIS — IMO0002 Reserved for concepts with insufficient information to code with codable children: Secondary | ICD-10-CM | POA: Insufficient documentation

## 2022-02-04 DIAGNOSIS — N429 Disorder of prostate, unspecified: Secondary | ICD-10-CM | POA: Insufficient documentation

## 2022-02-04 DIAGNOSIS — K08431 Partial loss of teeth due to caries, class I: Secondary | ICD-10-CM

## 2022-02-04 DIAGNOSIS — M109 Gout, unspecified: Secondary | ICD-10-CM | POA: Insufficient documentation

## 2022-02-04 DIAGNOSIS — M25559 Pain in unspecified hip: Secondary | ICD-10-CM

## 2022-02-04 DIAGNOSIS — F09 Unspecified mental disorder due to known physiological condition: Secondary | ICD-10-CM | POA: Insufficient documentation

## 2022-02-04 DIAGNOSIS — M459 Ankylosing spondylitis of unspecified sites in spine: Secondary | ICD-10-CM | POA: Insufficient documentation

## 2022-02-04 DIAGNOSIS — E785 Hyperlipidemia, unspecified: Secondary | ICD-10-CM

## 2022-02-04 DIAGNOSIS — H409 Unspecified glaucoma: Secondary | ICD-10-CM | POA: Insufficient documentation

## 2022-02-04 DIAGNOSIS — K2100 Gastro-esophageal reflux disease with esophagitis, without bleeding: Secondary | ICD-10-CM | POA: Insufficient documentation

## 2022-02-04 DIAGNOSIS — H905 Unspecified sensorineural hearing loss: Secondary | ICD-10-CM

## 2022-02-04 DIAGNOSIS — M25519 Pain in unspecified shoulder: Secondary | ICD-10-CM

## 2022-02-04 DIAGNOSIS — E559 Vitamin D deficiency, unspecified: Secondary | ICD-10-CM | POA: Insufficient documentation

## 2022-02-04 DIAGNOSIS — E7800 Pure hypercholesterolemia, unspecified: Secondary | ICD-10-CM

## 2022-02-04 DIAGNOSIS — H4031X3 Glaucoma secondary to eye trauma, right eye, severe stage: Secondary | ICD-10-CM

## 2022-02-04 DIAGNOSIS — J309 Allergic rhinitis, unspecified: Secondary | ICD-10-CM

## 2022-02-04 DIAGNOSIS — M545 Low back pain, unspecified: Secondary | ICD-10-CM

## 2022-02-04 DIAGNOSIS — G4720 Circadian rhythm sleep disorder, unspecified type: Secondary | ICD-10-CM | POA: Insufficient documentation

## 2022-02-04 DIAGNOSIS — I459 Conduction disorder, unspecified: Secondary | ICD-10-CM

## 2022-02-04 DIAGNOSIS — E118 Type 2 diabetes mellitus with unspecified complications: Secondary | ICD-10-CM

## 2022-02-04 DIAGNOSIS — M9420 Chondromalacia, unspecified site: Secondary | ICD-10-CM

## 2022-02-04 DIAGNOSIS — J45909 Unspecified asthma, uncomplicated: Secondary | ICD-10-CM | POA: Insufficient documentation

## 2022-02-04 DIAGNOSIS — E119 Type 2 diabetes mellitus without complications: Secondary | ICD-10-CM

## 2022-02-04 DIAGNOSIS — M23305 Other meniscus derangements, unspecified medial meniscus, unspecified knee: Secondary | ICD-10-CM | POA: Insufficient documentation

## 2022-02-04 DIAGNOSIS — H401113 Primary open-angle glaucoma, right eye, severe stage: Secondary | ICD-10-CM

## 2022-02-04 DIAGNOSIS — K036 Deposits [accretions] on teeth: Secondary | ICD-10-CM

## 2022-02-04 DIAGNOSIS — R002 Palpitations: Secondary | ICD-10-CM | POA: Insufficient documentation

## 2022-02-04 DIAGNOSIS — H269 Unspecified cataract: Secondary | ICD-10-CM

## 2022-02-04 DIAGNOSIS — M25569 Pain in unspecified knee: Secondary | ICD-10-CM

## 2022-02-04 DIAGNOSIS — R102 Pelvic and perineal pain: Secondary | ICD-10-CM

## 2022-02-04 DIAGNOSIS — H401122 Primary open-angle glaucoma, left eye, moderate stage: Secondary | ICD-10-CM

## 2022-02-04 HISTORY — DX: Gastro-esophageal reflux disease with esophagitis, without bleeding: K21.00

## 2022-02-04 HISTORY — DX: Partial loss of teeth due to caries, class I: K08.431

## 2022-02-04 HISTORY — DX: Chondromalacia, unspecified site: M94.20

## 2022-02-04 HISTORY — DX: Ankylosing spondylitis of unspecified sites in spine: M45.9

## 2022-02-04 HISTORY — DX: Pain in unspecified hip: M25.559

## 2022-02-04 HISTORY — DX: Other meniscus derangements, unspecified medial meniscus, unspecified knee: M23.305

## 2022-02-04 HISTORY — DX: Deposits (accretions) on teeth: K03.6

## 2022-02-04 HISTORY — DX: Reserved for concepts with insufficient information to code with codable children: IMO0002

## 2022-02-04 HISTORY — DX: Low back pain, unspecified: M54.50

## 2022-02-04 HISTORY — DX: Type 2 diabetes mellitus without complications: E11.9

## 2022-02-04 HISTORY — DX: Pelvic and perineal pain: R10.2

## 2022-02-04 HISTORY — DX: Conduction disorder, unspecified: I45.9

## 2022-02-04 HISTORY — DX: Allergic rhinitis, unspecified: J30.9

## 2022-02-04 HISTORY — DX: Hyperlipidemia, unspecified: E78.5

## 2022-02-04 HISTORY — DX: Essential (primary) hypertension: I10

## 2022-02-04 HISTORY — DX: Pain in unspecified knee: M25.569

## 2022-02-04 HISTORY — DX: Type 2 diabetes mellitus with unspecified complications: E11.8

## 2022-02-04 HISTORY — DX: Unspecified sensorineural hearing loss: H90.5

## 2022-02-04 HISTORY — DX: Lateral epicondylitis, right elbow: M77.11

## 2022-02-04 HISTORY — DX: Primary open-angle glaucoma, left eye, moderate stage: H40.1122

## 2022-02-04 HISTORY — DX: Pure hypercholesterolemia, unspecified: E78.00

## 2022-02-04 HISTORY — DX: Palpitations: R00.2

## 2022-02-04 HISTORY — DX: Vitamin D deficiency, unspecified: E55.9

## 2022-02-04 HISTORY — DX: Personal history of colonic polyps: Z86.010

## 2022-02-04 HISTORY — DX: Pain in unspecified shoulder: M25.519

## 2022-02-04 HISTORY — DX: Disorder of prostate, unspecified: N42.9

## 2022-02-04 HISTORY — DX: Personal history of colon polyps, unspecified: Z86.0100

## 2022-02-04 HISTORY — DX: Unspecified cataract: H26.9

## 2022-02-04 HISTORY — DX: Unspecified mental disorder due to known physiological condition: F09

## 2022-02-04 HISTORY — DX: Primary open-angle glaucoma, right eye, severe stage: H40.1113

## 2022-02-04 HISTORY — DX: Glaucoma secondary to eye trauma, right eye, severe stage: H40.31X3

## 2022-02-04 HISTORY — DX: Circadian rhythm sleep disorder, unspecified type: G47.20

## 2022-02-09 DIAGNOSIS — K5792 Diverticulitis of intestine, part unspecified, without perforation or abscess without bleeding: Secondary | ICD-10-CM | POA: Insufficient documentation

## 2022-02-09 DIAGNOSIS — E1140 Type 2 diabetes mellitus with diabetic neuropathy, unspecified: Secondary | ICD-10-CM | POA: Insufficient documentation

## 2022-02-09 DIAGNOSIS — K759 Inflammatory liver disease, unspecified: Secondary | ICD-10-CM | POA: Insufficient documentation

## 2022-02-09 DIAGNOSIS — K635 Polyp of colon: Secondary | ICD-10-CM | POA: Insufficient documentation

## 2022-02-09 DIAGNOSIS — T7840XA Allergy, unspecified, initial encounter: Secondary | ICD-10-CM | POA: Insufficient documentation

## 2022-02-09 DIAGNOSIS — E785 Hyperlipidemia, unspecified: Secondary | ICD-10-CM | POA: Insufficient documentation

## 2022-02-09 DIAGNOSIS — M1990 Unspecified osteoarthritis, unspecified site: Secondary | ICD-10-CM | POA: Insufficient documentation

## 2022-02-09 DIAGNOSIS — N419 Inflammatory disease of prostate, unspecified: Secondary | ICD-10-CM | POA: Insufficient documentation

## 2022-02-09 DIAGNOSIS — G4730 Sleep apnea, unspecified: Secondary | ICD-10-CM | POA: Insufficient documentation

## 2022-02-09 DIAGNOSIS — R079 Chest pain, unspecified: Secondary | ICD-10-CM | POA: Insufficient documentation

## 2022-02-09 DIAGNOSIS — F32A Depression, unspecified: Secondary | ICD-10-CM | POA: Insufficient documentation

## 2022-02-09 DIAGNOSIS — I499 Cardiac arrhythmia, unspecified: Secondary | ICD-10-CM | POA: Insufficient documentation

## 2022-02-09 DIAGNOSIS — E669 Obesity, unspecified: Secondary | ICD-10-CM | POA: Insufficient documentation

## 2022-02-09 DIAGNOSIS — E119 Type 2 diabetes mellitus without complications: Secondary | ICD-10-CM | POA: Insufficient documentation

## 2022-02-09 DIAGNOSIS — K219 Gastro-esophageal reflux disease without esophagitis: Secondary | ICD-10-CM | POA: Insufficient documentation

## 2022-02-09 DIAGNOSIS — J189 Pneumonia, unspecified organism: Secondary | ICD-10-CM | POA: Insufficient documentation

## 2022-02-24 ENCOUNTER — Ambulatory Visit (INDEPENDENT_AMBULATORY_CARE_PROVIDER_SITE_OTHER): Payer: Medicare Other | Admitting: Cardiology

## 2022-02-24 ENCOUNTER — Encounter: Payer: Self-pay | Admitting: Cardiology

## 2022-02-24 VITALS — BP 136/90 | HR 83 | Ht 68.0 in | Wt 221.6 lb

## 2022-02-24 DIAGNOSIS — I208 Other forms of angina pectoris: Secondary | ICD-10-CM

## 2022-02-24 DIAGNOSIS — E1149 Type 2 diabetes mellitus with other diabetic neurological complication: Secondary | ICD-10-CM

## 2022-02-24 DIAGNOSIS — I209 Angina pectoris, unspecified: Secondary | ICD-10-CM | POA: Insufficient documentation

## 2022-02-24 DIAGNOSIS — E1140 Type 2 diabetes mellitus with diabetic neuropathy, unspecified: Secondary | ICD-10-CM | POA: Diagnosis not present

## 2022-02-24 DIAGNOSIS — E66811 Obesity, class 1: Secondary | ICD-10-CM

## 2022-02-24 DIAGNOSIS — E7800 Pure hypercholesterolemia, unspecified: Secondary | ICD-10-CM

## 2022-02-24 DIAGNOSIS — E669 Obesity, unspecified: Secondary | ICD-10-CM

## 2022-02-24 DIAGNOSIS — I10 Essential (primary) hypertension: Secondary | ICD-10-CM

## 2022-02-24 HISTORY — DX: Obesity, unspecified: E66.9

## 2022-02-24 HISTORY — DX: Angina pectoris, unspecified: I20.9

## 2022-02-24 HISTORY — DX: Obesity, class 1: E66.811

## 2022-02-24 MED ORDER — NITROGLYCERIN 0.4 MG SL SUBL: 0.4000 mg | SUBLINGUAL_TABLET | SUBLINGUAL | 6 refills | Status: DC | PRN

## 2022-02-24 MED ORDER — METOPROLOL TARTRATE 100 MG PO TABS: 100.0000 mg | ORAL_TABLET | Freq: Once | ORAL | 0 refills | Status: DC

## 2022-02-24 NOTE — Progress Notes (Signed)
?Cardiology Office Note:   ? ?Date:  02/24/2022  ? ?ID:  Johnny Vasquez, DOB Nov 20, 1950, MRN [id number] ? ?PCP:  Maryella Shivers, MD  ?Cardiologist:  Jenean Lindau, MD  ? ?Referring MD: Maryella Shivers, MD  ? ? ?ASSESSMENT:   ? ?1. Essential (primary) hypertension   ?2. Pure hypercholesterolemia   ?3. Diabetic neuropathy with neurologic complication ([place])   ?4. Angina pectoris ([place])   ?5. Obesity (BMI 30.0-34.9)   ? ?PLAN:   ? ?In order of problems listed above: ? ?Primary prevention stressed with the patient.  Importance of compliance with diet medication stressed and vocalized understanding. ?Chest pain/angina pectoris: Patient's symptoms are concerning.  Patient has multiple risk factors for coronary artery disease.  Sublingual nitroglycerin prescription was sent, its protocol and 911 protocol explained and the patient vocalized understanding questions were answered to the patient's satisfaction.  I gave him options of conventional and CT-based coronary angiography and he prefers the latter.  Further recommendations will be made based on the findings of that test. ?Essential hypertension: Blood pressure stable and diet was emphasized.  Lifestyle modification was urged.  Salt intake issues discussed ?Mixed dyslipidemia diabetes mellitus: Diet emphasized.  Lipids are followed by primary care and they seem to be fine. ?Patient was advised to take a coated baby aspirin on a regular basis during the aforementioned investigation was completed. ?Cardiac murmur: Echocardiogram will be done to assess murmur heard on auscultation. ?Patient will be seen in follow-up appointment in 6 months or earlier if the patient has any concerns ? ? ? ?Medication Adjustments/Labs and Tests Ordered: ?Current medicines are reviewed at length with the patient today.  Concerns regarding medicines are outlined above.  ?No orders of the defined types were placed in this encounter. ? ?No orders of the defined types were placed in this  encounter. ? ? ? ?History of Present Illness:   ? ?Johnny Vasquez is a 71 y.o. male who is being seen today for the evaluation of an abnormal nuclear stress test and chest pain at the request of Maryella Shivers, MD. patient is a pleasant 71 year old male.  He has past medical history of essential hypertension, mixed dyslipidemia, diabetes and obesity.  He leads a sedentary lifestyle.  He mentions to me that he occasionally has chest tightness especially when he is under stress or when he is in a rush.  No orthopnea.  No radiation to the neck or to the arms.  He is concerned about this.  Stress test was abnormal but did not reveal any evidence of ischemia.  Patient's ongoing symptoms have concerned him and he is here for evaluation.  At the time of my evaluation, the patient is alert awake oriented and in no distress. ? ?Past Medical History:  ?Diagnosis Date  ? Allergy   ? Ankle fracture 08/11/2017  ? Oct 18, 2017 Entered By: Albin Felling Comment: s/p ORIF at [facility] in [place]  ? Ankylosing spondylitis ([place]) 02/04/2022  ? Arthritis   ? Asthma   ? Cardiac conduction disorder 02/04/2022  ? Cataract 02/04/2022  ? Chest pain   ? Chondromalacia 02/04/2022  ? Colon polyps   ? Deposits (accretions) on teeth 02/04/2022  ? Depression   ? PTSD  ? Diabetes mellitus without complication ([place])   ? Diabetic neuropathy with neurologic complication ([place])   ? Disorder of prostate 02/04/2022  ? Diverticulitis   ? Dysfunctions associated with sleep stages or arousal from sleep 02/04/2022  ? Dysrhythmia   ? irregular heart rate  ?  Essential (primary) hypertension 02/04/2022  ? GERD (gastroesophageal reflux disease)   ? Glaucoma   ? Glaucoma secondary to eye trauma, right eye, severe stage 02/04/2022  ? Gout   ? Hepatitis   ? pt doesn't remember what type  ? Hip joint pain 02/04/2022  ? History of colonic polyps 02/04/2022  ? Jul 28, 2006 Entered By: Rosina Lowenstein Comment:  s/p colonoscopy by pmd 2006 rpt  5 yrsAug 12, 2011  Entered By: Rosina Lowenstein Comment: colonoscopy by pmd  7/11 PMD rpt 5 yrs  ? Hyperlipidemia   ? Lateral epicondylitis, right elbow 02/04/2022  ? Low back pain 02/04/2022  ? Sep 02, 2008 Entered By: Rosina Lowenstein Comment: MRI 7/09-Disc protrusion L4-5  with mil/mod  foraminal stenoDec 03, 2009 Entered By: Rushie Chestnut P Comment: Refused  neurosx  consult/surgerySep 29, 2011 Entered By: Rosina Lowenstein Comment: MRI 9/11- DDD L4-5  with  mod/severe foraminal stenosisSep 29, 2011 Entered By: Rosina Lowenstein Comment: 10/15/10-L4-5 discetomy/laminectomy [facility]  neurosxMay 09, 2012 Entered  ? Obese   ? Other and unspecified derangement of medial meniscus 02/04/2022  ? Sep 12, 2009 Entered By: Rosina Lowenstein Comment: s/p arthroscopic sx 10/10 [facility]  ? Other and unspecified hyperlipidemia 02/04/2022  ? Pain in joint, lower leg 02/04/2022  ? Pain in joint, shoulder region 02/04/2022  ? Sep 02, 2008 Entered By: Rosina Lowenstein Comment:  s/p surgery for rotator cuff tear- s/b orthoDec 03, 2010 Entered By: Rushie Chestnut P Comment: s/p Lt knee  arthroscopic sx  for meniscal tear-10/10 [facility]  ? Palpitations 02/04/2022  ? Partial loss of teeth due to caries, class I 02/04/2022  ? Pelvic and perineal pain 02/04/2022  ? Pneumonia   ? Primary open-angle glaucoma, left eye, moderate stage 02/04/2022  ? Primary open-angle glaucoma, right eye, severe stage 02/04/2022  ? Prostatitis   ? Pure hypercholesterolemia 02/04/2022  ? Reflux esophagitis 02/04/2022  ? Rhinitis, allergic 02/04/2022  ? Sensorineural hearing loss 02/04/2022  ? Sleep apnea   ? uses CPAP  ? Thoracic or lumbosacral neuritis or radiculitis 02/04/2022  ? Type 2 or unspecified type diabetes mellitus 02/04/2022  ? Sep 02, 2008 Entered By: Rosina Lowenstein Comment: on diet only -good a1c goals  ? Unspecified persistent mental disorders due to conditions classified elsewhere 02/04/2022  ? May 09, 2009 Entered By: Rushie Chestnut P Comment:  f/b MHC on meds  ? Vitamin D deficiency 02/04/2022  ? ? ?Past Surgical History:   ?Procedure Laterality Date  ? BACK SURGERY  2007  ? Hemilaminectomy Jan. 5 and 25th  ? COLONOSCOPY  05/27/2016  ? COLONOSCOPY WITH PROPOFOL N/A 05/12/2020  ? Procedure: COLONOSCOPY WITH PROPOFOL;  Surgeon: Mansouraty, Telford Nab., MD;  Location: [place];  Service: Gastroenterology;  Laterality: N/A;  ? COLONOSCOPY WITH PROPOFOL N/A 06/18/2021  ? Procedure: COLONOSCOPY WITH PROPOFOL;  Surgeon: Mansouraty, Telford Nab., MD;  Location: [place];  Service: Gastroenterology;  Laterality: N/A;  ? ENDOSCOPIC MUCOSAL RESECTION N/A 05/12/2020  ? Procedure: ENDOSCOPIC MUCOSAL RESECTION;  Surgeon: Rush Landmark Telford Nab., MD;  Location: [place];  Service: Gastroenterology;  Laterality: N/A;  ? HEMOSTASIS CLIP PLACEMENT  05/12/2020  ? Procedure: HEMOSTASIS CLIP PLACEMENT;  Surgeon: Irving Copas., MD;  Location: [place];  Service: Gastroenterology;;  ? KNEE ARTHROSCOPY Bilateral   ? POLYPECTOMY  05/12/2020  ? Procedure: POLYPECTOMY;  Surgeon: Irving Copas., MD;  Location: [place];  Service: Gastroenterology;;  ? POLYPECTOMY  06/18/2021  ? Procedure: POLYPECTOMY;  Surgeon: Rush Landmark Telford Nab., MD;  Location: [place];  Service: Gastroenterology;;  ? SHOULDER ARTHROSCOPY Left   ? SUBMUCOSAL LIFTING INJECTION  05/12/2020  ? Procedure: SUBMUCOSAL LIFTING INJECTION;  Surgeon: Irving Copas., MD;  Location: [place];  Service: Gastroenterology;;  ? TIBIA FRACTURE SURGERY    ? TONSILLECTOMY AND ADENOIDECTOMY    ? VASECTOMY    ? ? ?Current Medications: ?Current Meds  ?Medication Sig  ? allopurinol (ZYLOPRIM) 300 MG tablet Take 300 mg by mouth daily as needed (gout).  ? amLODipine (NORVASC) 5 MG tablet Take 5 mg by mouth daily.  ? brimonidine (ALPHAGAN) 0.2 % ophthalmic solution Place 1 drop into both eyes 3 (three) times daily.   ? carboxymethylcellulose (REFRESH PLUS) 0.5 % SOLN Place 1 drop into both eyes 3 (three) times daily as needed (dry eyes).   ? fluticasone  (FLONASE) 50 MCG/ACT nasal spray Place 1 spray into both nostrils 2 (two) times daily as needed for allergies.  ? lidocaine 4 % Place 1 patch onto the skin daily as needed (pain).  ? loratadine (CLARITIN) 10 MG tablet [place]

## 2022-02-24 NOTE — Patient Instructions (Signed)
Medication Instructions:  ?Your physician has recommended you make the following change in your medication:  ? ?Use nitroglycerin 1 tablet placed under the tongue at the first sign of chest pain or an angina attack. 1 tablet may be used every 5 minutes as needed, for up to 15 minutes. Do not take more than 3 tablets in 15 minutes. If pain persist call 911 or go to the nearest ED. ? ? ?*If you need a refill on your cardiac medications before your next appointment, please call your pharmacy* ? ? ?Lab Work: ?Your physician recommends that you return for lab work 1 week prior to your CT scan for a BMET. ?You do not need to fast. You can come Monday through Friday 8:30 am to 12:00 pm and 1:15 to 4:30. You do not need to make an appointment as the order has already been placed.  ? ? ?If you have labs (blood work) drawn today and your tests are completely normal, you will receive your results only by: ?MyChart Message (if you have MyChart) OR ?A paper copy in the mail ?If you have any lab test that is abnormal or we need to change your treatment, we will call you to review the results. ? ? ?Testing/Procedures: ? ? ?Your cardiac CT will be scheduled at one of the below locations:  ? ?[facility] ?[address] ?[place], [place] [address] ?[phone] ? ? ?At [facility], please arrive at the [facility]'s Entrance (Entrance C2) of [facility] 30 minutes prior to test start time. ?You can use the FREE valet parking offered at entrance C (encouraged to control the heart rate for the test)  ?Proceed to the [facility] Radiology Department (first floor) to check-in and test prep. ? ?All radiology patients and guests should use entrance C2 at [facility], accessed from [facility], even though the hospital's physical address listed is [address] ? ? ? ? ? ?Please follow these instructions carefully (unless otherwise directed): ? ?Hold all erectile  dysfunction medications at least 3 days (72 hrs) prior to test. ? ?On the Night Before the Test: ?Be sure to Drink plenty of water. ?Do not consume any caffeinated/decaffeinated beverages or chocolate 12 hours prior to your test. ?Do not take any antihistamines 12 hours prior to your test. ? ? ?On the Day of the Test: ?Drink plenty of water until 1 hour prior to the test. ?Do not eat any food 4 hours prior to the test. ?You may take your regular medications prior to the test.  ?Take metoprolol (Lopressor) two hours prior to test. ? ? ?After the Test: ?Drink plenty of water. ?After receiving IV contrast, you may experience a mild flushed feeling. This is normal. ?On occasion, you may experience a mild rash up to 24 hours after the test. This is not dangerous. If this occurs, you can take Benadryl 25 mg and increase your fluid intake. ?If you experience trouble breathing, this can be serious. If it is severe call 911 IMMEDIATELY. If it is mild, please call our office. ?If you take any of these medications: Glipizide/Metformin, Avandament, Glucavance, please do not take 48 hours after completing test unless otherwise instructed. ? ?We will call to schedule your test 2-4 weeks out understanding that some insurance companies will need an authorization prior to the service being performed.  ? ?For non-scheduling related questions, please contact the cardiac imaging nurse navigator should you have any questions/concerns: ?Marchia Bond,  Cardiac Imaging Nurse Navigator ?Gordy Clement, Cardiac Imaging Nurse Navigator ?[facility] ?Direct Office Dial: [phone]  ? ?For scheduling needs, including cancellations and rescheduling, please call [place], [phone].  ? ?Your physician has requested that you have an echocardiogram. Echocardiography is a painless test that uses sound waves to create images of your heart. It provides your doctor with information about the size and shape of your heart  and how well your heart?s chambers and valves are working. This procedure takes approximately one hour. There are no restrictions for this procedure. ? ?Follow-Up: ?At [facility], you and your health needs are our priority.  As part of our continuing mission to provide you with exceptional heart care, we have created designated Provider Care Teams.  These Care Teams include your primary Cardiologist (physician) and Advanced Practice Providers (APPs -  Physician Assistants and Nurse Practitioners) who all work together to provide you with the care you need, when you need it. ? ?We recommend signing up for the patient portal called "MyChart".  Sign up information is provided on this After Visit Summary.  MyChart is used to connect with patients for Virtual Visits (Telemedicine).  Patients are able to view lab/test results, encounter notes, upcoming appointments, etc.  Non-urgent messages can be sent to your provider as well.   ?To learn more about what you can do with MyChart, go to [email or web address].   ? ?Your next appointment:   ?9 month(s) ? ?The format for your next appointment:   ?In Person ? ?Provider:   ?Jyl Heinz, MD ? ? ?Other Instructions ?Cardiac CT Angiogram ?A cardiac CT angiogram is a procedure to look at the heart and the area around the heart. It may be done to help find the cause of chest pains or other symptoms of heart disease. During this procedure, a substance called contrast dye is injected into the blood vessels in the area to be checked. A large X-ray machine, called a CT scanner, then takes detailed pictures of the heart and the surrounding area. The procedure is also sometimes called a coronary CT angiogram, coronary artery scanning, or CTA. ?A cardiac CT angiogram allows the health care provider to see how well blood is flowing to and from the heart. The health care provider will be able to see if there are any problems, such as: ?Blockage or narrowing of the coronary arteries  in the heart. ?Fluid around the heart. ?Signs of weakness or disease in the muscles, valves, and tissues of the heart. ?Tell a health care provider about: ?Any allergies you have. This is especially important if you have had a previous allergic reaction to contrast dye. ?All medicines you are taking, including vitamins, herbs, eye drops, creams, and over-the-counter medicines. ?Any blood disorders you have. ?Any surgeries you have had. ?Any medical conditions you have. ?Whether you are pregnant or may be pregnant. ?Any anxiety disorders, chronic pain, or other conditions you have that may increase your stress or prevent you from lying still. ?What are the risks? ?Generally, this is a safe procedure. However, problems may occur, including: ?Bleeding. ?Infection. ?Allergic reactions to medicines or dyes. ?Damage to other structures or organs. ?Kidney damage from the contrast dye that is used. ?Increased risk of cancer from radiation exposure. This risk is low. Talk with your health care provider about: ?The risks and benefits of testing. ?How you can receive the lowest dose of radiation. ?What happens before the procedure? ?Wear comfortable clothing and remove any jewelry, glasses, dentures,  and hearing aids. ?Follow instructions from your health care provider about eating and drinking. This may include: ?For 12 hours before the procedure -- avoid caffeine. This includes tea, coffee, soda, energy drinks, and diet pills. Drink plenty of water or other fluids that do not have caffeine in them. Being well hydrated can prevent complications. ?For 4-6 hours before the procedure -- stop eating and drinking. The contrast dye can cause nausea, but this is less likely if your stomach is empty. ?Ask your health care provider about changing or stopping your regular medicines. This is especially important if you are taking diabetes medicines, blood thinners, or medicines to treat problems with erections (erectile  dysfunction). ?What happens during the procedure? ? ?Hair on your chest may need to be removed so that small sticky patches called electrodes can be placed on your chest. These will transmit information that helps to monitor

## 2022-03-02 ENCOUNTER — Other Ambulatory Visit: Payer: Medicare Other

## 2022-03-02 ENCOUNTER — Ambulatory Visit (INDEPENDENT_AMBULATORY_CARE_PROVIDER_SITE_OTHER): Payer: Medicare Other

## 2022-03-02 DIAGNOSIS — I209 Angina pectoris, unspecified: Secondary | ICD-10-CM | POA: Diagnosis not present

## 2022-03-02 LAB — ECHOCARDIOGRAM COMPLETE
Area-P 1/2: 3.77 cm2
S' Lateral: 2.7 cm

## 2022-03-16 ENCOUNTER — Other Ambulatory Visit: Payer: Self-pay

## 2022-03-16 DIAGNOSIS — I209 Angina pectoris, unspecified: Secondary | ICD-10-CM

## 2022-03-16 MED ORDER — METOPROLOL TARTRATE 100 MG PO TABS: 100.0000 mg | ORAL_TABLET | Freq: Once | ORAL | 0 refills | Status: DC

## 2022-03-17 ENCOUNTER — Telehealth (HOSPITAL_COMMUNITY): Payer: Self-pay | Admitting: *Deleted

## 2022-03-17 ENCOUNTER — Other Ambulatory Visit: Payer: Self-pay | Admitting: Cardiology

## 2022-03-17 DIAGNOSIS — I209 Angina pectoris, unspecified: Secondary | ICD-10-CM

## 2022-03-17 LAB — BASIC METABOLIC PANEL
BUN/Creatinine Ratio: 13 (ref 10–24)
BUN: 17 mg/dL (ref 8–27)
CO2: 25 mmol/L (ref 20–29)
Calcium: 10 mg/dL (ref 8.6–10.2)
Chloride: 101 mmol/L (ref 96–106)
Creatinine, Ser: 1.26 mg/dL (ref 0.76–1.27)
Glucose: 121 mg/dL — ABNORMAL HIGH (ref 70–99)
Potassium: 4.1 mmol/L (ref 3.5–5.2)
Sodium: 140 mmol/L (ref 134–144)
eGFR: 60 mL/min/{1.73_m2} (ref >59)

## 2022-03-17 NOTE — Telephone Encounter (Signed)
Reaching out to patient to offer assistance regarding upcoming cardiac imaging study; pt verbalizes understanding of appt date/time, parking situation and where to check in, pre-test NPO status and medications ordered, and verified current allergies; name and call back number provided for further questions should they arise  Zinia Innocent RN Navigator Cardiac Imaging [facility] [phone] office [phone] cell  Patient to take 100mg metoprolol tartrate two hours prior to his cardiac CT scan.  He is aware to arrive at 2:30pm. 

## 2022-03-18 ENCOUNTER — Ambulatory Visit (HOSPITAL_COMMUNITY)
Admission: RE | Admit: 2022-03-18 | Discharge: 2022-03-18 | Disposition: A | Discharge status: Home / Self Care | Payer: Medicare Other | Source: Ambulatory Visit | Attending: Cardiology | Admitting: Cardiology

## 2022-03-18 DIAGNOSIS — I208 Other forms of angina pectoris: Secondary | ICD-10-CM | POA: Insufficient documentation

## 2022-03-18 DIAGNOSIS — I209 Angina pectoris, unspecified: Secondary | ICD-10-CM | POA: Insufficient documentation

## 2022-03-18 MED ORDER — NITROGLYCERIN 0.4 MG SL SUBL
0.8000 mg | SUBLINGUAL_TABLET | Freq: Once | SUBLINGUAL | Status: AC
  Administered 2022-03-18: 0.8 mg via SUBLINGUAL

## 2022-03-18 MED ORDER — IOHEXOL 350 MG/ML SOLN
100.0000 mL | Freq: Once | INTRAVENOUS | Status: AC | PRN
  Administered 2022-03-18: 100 mL via INTRAVENOUS

## 2022-03-18 MED ORDER — NITROGLYCERIN 0.4 MG SL SUBL
SUBLINGUAL_TABLET | SUBLINGUAL | Status: AC
  Filled 2022-03-18: qty 2

## 2022-03-24 ENCOUNTER — Telehealth: Payer: Self-pay

## 2022-03-24 DIAGNOSIS — I25119 Atherosclerotic heart disease of native coronary artery with unspecified angina pectoris: Secondary | ICD-10-CM

## 2022-03-24 DIAGNOSIS — E781 Pure hyperglyceridemia: Secondary | ICD-10-CM

## 2022-03-24 DIAGNOSIS — E7800 Pure hypercholesterolemia, unspecified: Secondary | ICD-10-CM

## 2022-03-24 DIAGNOSIS — I209 Angina pectoris, unspecified: Secondary | ICD-10-CM

## 2022-03-24 MED ORDER — ASPIRIN 81 MG PO TBEC: 81.0000 mg | DELAYED_RELEASE_TABLET | Freq: Every day | ORAL | 3 refills | Status: AC

## 2022-03-24 NOTE — Telephone Encounter (Signed)
Results reviewed with pt as per Dr. Revankar's note.  Pt verbalized understanding and had no additional questions. Routed to PCP.  

## 2022-03-29 DIAGNOSIS — E7800 Pure hypercholesterolemia, unspecified: Secondary | ICD-10-CM | POA: Diagnosis not present

## 2022-03-29 DIAGNOSIS — I209 Angina pectoris, unspecified: Secondary | ICD-10-CM | POA: Diagnosis not present

## 2022-03-29 DIAGNOSIS — I25119 Atherosclerotic heart disease of native coronary artery with unspecified angina pectoris: Secondary | ICD-10-CM | POA: Diagnosis not present

## 2022-03-30 LAB — LIPID PANEL
Chol/HDL Ratio: 3.8 ratio (ref 0.0–5.0)
Cholesterol, Total: 148 mg/dL (ref 100–199)
HDL: 39 mg/dL — ABNORMAL LOW (ref >39)
LDL Chol Calc (NIH): 69 mg/dL (ref 0–99)
Triglycerides: 247 mg/dL — ABNORMAL HIGH (ref 0–149)
VLDL Cholesterol Cal: 40 mg/dL (ref 5–40)

## 2022-03-30 LAB — BASIC METABOLIC PANEL
BUN/Creatinine Ratio: 16 (ref 10–24)
BUN: 19 mg/dL (ref 8–27)
CO2: 21 mmol/L (ref 20–29)
Calcium: 9.8 mg/dL (ref 8.6–10.2)
Chloride: 99 mmol/L (ref 96–106)
Creatinine, Ser: 1.18 mg/dL (ref 0.76–1.27)
Glucose: 209 mg/dL — ABNORMAL HIGH (ref 70–99)
Potassium: 4.2 mmol/L (ref 3.5–5.2)
Sodium: 135 mmol/L (ref 134–144)
eGFR: 65 mL/min/{1.73_m2} (ref >59)

## 2022-03-30 LAB — HEPATIC FUNCTION PANEL
ALT: 18 IU/L (ref 0–44)
AST: 21 IU/L (ref 0–40)
Albumin: 4.7 g/dL (ref 3.7–4.7)
Alkaline Phosphatase: 86 IU/L (ref 44–121)
Bilirubin Total: 0.6 mg/dL (ref 0.0–1.2)
Bilirubin, Direct: 0.16 mg/dL (ref 0.00–0.40)
Total Protein: 7.4 g/dL (ref 6.0–8.5)

## 2022-03-31 MED ORDER — FISH OIL 1000 MG PO CAPS: 2.0000 | ORAL_CAPSULE | Freq: Two times a day (BID) | ORAL | 12 refills | Status: AC

## 2022-03-31 NOTE — Addendum Note (Signed)
Addended by: Truddie Hidden on: 03/31/2022 05:26 PM   Modules accepted: Orders

## 2022-05-25 DIAGNOSIS — E785 Hyperlipidemia, unspecified: Secondary | ICD-10-CM | POA: Diagnosis not present

## 2022-05-25 DIAGNOSIS — E1140 Type 2 diabetes mellitus with diabetic neuropathy, unspecified: Secondary | ICD-10-CM | POA: Diagnosis not present

## 2022-05-25 DIAGNOSIS — I10 Essential (primary) hypertension: Secondary | ICD-10-CM | POA: Diagnosis not present

## 2022-05-31 DIAGNOSIS — E785 Hyperlipidemia, unspecified: Secondary | ICD-10-CM | POA: Diagnosis not present

## 2022-05-31 DIAGNOSIS — Z6833 Body mass index (BMI) 33.0-33.9, adult: Secondary | ICD-10-CM | POA: Diagnosis not present

## 2022-05-31 DIAGNOSIS — E1165 Type 2 diabetes mellitus with hyperglycemia: Secondary | ICD-10-CM | POA: Diagnosis not present

## 2022-05-31 DIAGNOSIS — E1140 Type 2 diabetes mellitus with diabetic neuropathy, unspecified: Secondary | ICD-10-CM | POA: Diagnosis not present

## 2022-05-31 DIAGNOSIS — I10 Essential (primary) hypertension: Secondary | ICD-10-CM | POA: Diagnosis not present

## 2022-07-12 DIAGNOSIS — R0989 Other specified symptoms and signs involving the circulatory and respiratory systems: Secondary | ICD-10-CM | POA: Diagnosis not present

## 2022-07-12 DIAGNOSIS — J020 Streptococcal pharyngitis: Secondary | ICD-10-CM | POA: Diagnosis not present

## 2022-07-12 DIAGNOSIS — Z6833 Body mass index (BMI) 33.0-33.9, adult: Secondary | ICD-10-CM | POA: Diagnosis not present

## 2022-07-12 DIAGNOSIS — Z20822 Contact with and (suspected) exposure to covid-19: Secondary | ICD-10-CM | POA: Diagnosis not present

## 2022-07-21 DIAGNOSIS — R82998 Other abnormal findings in urine: Secondary | ICD-10-CM | POA: Diagnosis not present

## 2022-07-21 DIAGNOSIS — R1032 Left lower quadrant pain: Secondary | ICD-10-CM | POA: Diagnosis not present

## 2022-07-21 DIAGNOSIS — Z6833 Body mass index (BMI) 33.0-33.9, adult: Secondary | ICD-10-CM | POA: Diagnosis not present

## 2022-07-27 DIAGNOSIS — N289 Disorder of kidney and ureter, unspecified: Secondary | ICD-10-CM | POA: Diagnosis not present

## 2022-07-27 DIAGNOSIS — Z6833 Body mass index (BMI) 33.0-33.9, adult: Secondary | ICD-10-CM | POA: Diagnosis not present

## 2022-07-27 DIAGNOSIS — R1084 Generalized abdominal pain: Secondary | ICD-10-CM | POA: Diagnosis not present

## 2022-07-27 DIAGNOSIS — R1032 Left lower quadrant pain: Secondary | ICD-10-CM | POA: Diagnosis not present

## 2022-07-29 DIAGNOSIS — N289 Disorder of kidney and ureter, unspecified: Secondary | ICD-10-CM | POA: Diagnosis not present

## 2022-07-29 DIAGNOSIS — R1032 Left lower quadrant pain: Secondary | ICD-10-CM | POA: Diagnosis not present

## 2022-07-30 ENCOUNTER — Other Ambulatory Visit: Payer: Self-pay | Admitting: Urology

## 2022-07-30 ENCOUNTER — Encounter (HOSPITAL_BASED_OUTPATIENT_CLINIC_OR_DEPARTMENT_OTHER): Payer: Self-pay | Admitting: Urology

## 2022-07-30 DIAGNOSIS — N201 Calculus of ureter: Secondary | ICD-10-CM | POA: Diagnosis not present

## 2022-07-30 NOTE — Progress Notes (Signed)
Talked with patient. Hx and meds reviewed. Arrival time 0915, solids and clear liquids until 0515. To bring CPAP to surg. Center. Daughter will be the driver

## 2022-08-02 ENCOUNTER — Encounter (HOSPITAL_BASED_OUTPATIENT_CLINIC_OR_DEPARTMENT_OTHER)
Admission: RE | Disposition: A | Discharge status: Home / Self Care | Payer: Self-pay | Source: Home / Self Care | Attending: Urology

## 2022-08-02 ENCOUNTER — Ambulatory Visit (HOSPITAL_BASED_OUTPATIENT_CLINIC_OR_DEPARTMENT_OTHER)
Admission: RE | Admit: 2022-08-02 | Discharge: 2022-08-02 | Disposition: A | Discharge status: Home / Self Care | Payer: Medicare Other | Attending: Urology | Admitting: Urology

## 2022-08-02 ENCOUNTER — Ambulatory Visit (HOSPITAL_COMMUNITY): Payer: Medicare Other

## 2022-08-02 ENCOUNTER — Other Ambulatory Visit: Payer: Self-pay

## 2022-08-02 ENCOUNTER — Encounter (HOSPITAL_BASED_OUTPATIENT_CLINIC_OR_DEPARTMENT_OTHER): Payer: Self-pay | Admitting: Urology

## 2022-08-02 DIAGNOSIS — M47816 Spondylosis without myelopathy or radiculopathy, lumbar region: Secondary | ICD-10-CM | POA: Diagnosis not present

## 2022-08-02 DIAGNOSIS — Z01818 Encounter for other preprocedural examination: Secondary | ICD-10-CM | POA: Diagnosis not present

## 2022-08-02 DIAGNOSIS — M160 Bilateral primary osteoarthritis of hip: Secondary | ICD-10-CM | POA: Diagnosis not present

## 2022-08-02 DIAGNOSIS — N201 Calculus of ureter: Secondary | ICD-10-CM | POA: Diagnosis not present

## 2022-08-02 HISTORY — PX: EXTRACORPOREAL SHOCK WAVE LITHOTRIPSY: SHX1557

## 2022-08-02 LAB — GLUCOSE, CAPILLARY
Glucose-Capillary: 150 mg/dL — ABNORMAL HIGH (ref 70–99)
Glucose-Capillary: 114 mg/dL — ABNORMAL HIGH (ref 70–99)

## 2022-08-02 SURGERY — LITHOTRIPSY, ESWL
Anesthesia: LOCAL | Laterality: Left

## 2022-08-02 MED ORDER — DIPHENHYDRAMINE HCL 25 MG PO CAPS
25.0000 mg | ORAL_CAPSULE | ORAL | Status: AC
  Administered 2022-08-02: 25 mg via ORAL

## 2022-08-02 MED ORDER — CIPROFLOXACIN HCL 500 MG PO TABS
ORAL_TABLET | ORAL | Status: AC
  Filled 2022-08-02: qty 1

## 2022-08-02 MED ORDER — DIAZEPAM 5 MG PO TABS
ORAL_TABLET | ORAL | Status: AC
  Filled 2022-08-02: qty 2

## 2022-08-02 MED ORDER — DIAZEPAM 5 MG PO TABS
10.0000 mg | ORAL_TABLET | ORAL | Status: AC
  Administered 2022-08-02: 10 mg via ORAL

## 2022-08-02 MED ORDER — SODIUM CHLORIDE 0.9 % IV SOLN: INTRAVENOUS | Status: DC

## 2022-08-02 MED ORDER — DIPHENHYDRAMINE HCL 25 MG PO CAPS
ORAL_CAPSULE | ORAL | Status: AC
  Filled 2022-08-02: qty 1

## 2022-08-02 MED ORDER — OXYCODONE-ACETAMINOPHEN 5-325 MG PO TABS: 1.0000 | ORAL_TABLET | ORAL | 0 refills | Status: DC | PRN

## 2022-08-02 MED ORDER — CIPROFLOXACIN HCL 500 MG PO TABS
500.0000 mg | ORAL_TABLET | ORAL | Status: AC
  Administered 2022-08-02: 500 mg via ORAL

## 2022-08-02 NOTE — Op Note (Signed)
[facility] Operative Note  Treating Physician: Ellison Hughs, MD  Pre-op diagnosis: 7 mm left mid-ureteral stone  Post-op diagnosis: Same   Procedure: LEFT ESWL   See Aris Everts OP note scanned into chart. Also because of the size, density, location and other factors that cannot be anticipated I feel this will likely be a staged procedure. This fact supersedes any indication in the scanned [place] stone operative note to the contrary

## 2022-08-02 NOTE — Discharge Instructions (Signed)

## 2022-08-02 NOTE — H&P (Signed)
See scanned [facility] documents for H&P.   

## 2022-08-03 ENCOUNTER — Encounter (HOSPITAL_BASED_OUTPATIENT_CLINIC_OR_DEPARTMENT_OTHER): Payer: Self-pay | Admitting: Urology

## 2022-08-17 DIAGNOSIS — N201 Calculus of ureter: Secondary | ICD-10-CM | POA: Diagnosis not present

## 2022-09-28 DIAGNOSIS — I10 Essential (primary) hypertension: Secondary | ICD-10-CM | POA: Diagnosis not present

## 2022-09-28 DIAGNOSIS — E1140 Type 2 diabetes mellitus with diabetic neuropathy, unspecified: Secondary | ICD-10-CM | POA: Diagnosis not present

## 2022-09-28 DIAGNOSIS — R0981 Nasal congestion: Secondary | ICD-10-CM | POA: Diagnosis not present

## 2022-09-28 DIAGNOSIS — Z20822 Contact with and (suspected) exposure to covid-19: Secondary | ICD-10-CM | POA: Diagnosis not present

## 2022-09-28 DIAGNOSIS — E785 Hyperlipidemia, unspecified: Secondary | ICD-10-CM | POA: Diagnosis not present

## 2022-10-01 DIAGNOSIS — I10 Essential (primary) hypertension: Secondary | ICD-10-CM | POA: Diagnosis not present

## 2022-10-01 DIAGNOSIS — E785 Hyperlipidemia, unspecified: Secondary | ICD-10-CM | POA: Diagnosis not present

## 2022-10-01 DIAGNOSIS — E1122 Type 2 diabetes mellitus with diabetic chronic kidney disease: Secondary | ICD-10-CM | POA: Diagnosis not present

## 2022-10-01 DIAGNOSIS — N1831 Chronic kidney disease, stage 3a: Secondary | ICD-10-CM | POA: Diagnosis not present

## 2022-10-01 DIAGNOSIS — Z6833 Body mass index (BMI) 33.0-33.9, adult: Secondary | ICD-10-CM | POA: Diagnosis not present

## 2022-10-13 DIAGNOSIS — N201 Calculus of ureter: Secondary | ICD-10-CM | POA: Diagnosis not present

## 2022-11-18 DIAGNOSIS — N200 Calculus of kidney: Secondary | ICD-10-CM | POA: Diagnosis not present

## 2022-11-19 DIAGNOSIS — Z6833 Body mass index (BMI) 33.0-33.9, adult: Secondary | ICD-10-CM | POA: Diagnosis not present

## 2022-11-19 DIAGNOSIS — L209 Atopic dermatitis, unspecified: Secondary | ICD-10-CM | POA: Diagnosis not present

## 2023-02-08 DIAGNOSIS — E785 Hyperlipidemia, unspecified: Secondary | ICD-10-CM | POA: Diagnosis not present

## 2023-02-08 DIAGNOSIS — E1140 Type 2 diabetes mellitus with diabetic neuropathy, unspecified: Secondary | ICD-10-CM | POA: Diagnosis not present

## 2023-02-08 DIAGNOSIS — I10 Essential (primary) hypertension: Secondary | ICD-10-CM | POA: Diagnosis not present

## 2023-02-14 DIAGNOSIS — E785 Hyperlipidemia, unspecified: Secondary | ICD-10-CM | POA: Diagnosis not present

## 2023-02-14 DIAGNOSIS — Z6833 Body mass index (BMI) 33.0-33.9, adult: Secondary | ICD-10-CM | POA: Diagnosis not present

## 2023-02-14 DIAGNOSIS — E1122 Type 2 diabetes mellitus with diabetic chronic kidney disease: Secondary | ICD-10-CM | POA: Diagnosis not present

## 2023-02-14 DIAGNOSIS — I10 Essential (primary) hypertension: Secondary | ICD-10-CM | POA: Diagnosis not present

## 2023-02-15 ENCOUNTER — Other Ambulatory Visit: Payer: Self-pay

## 2023-02-15 DIAGNOSIS — Z7409 Other reduced mobility: Secondary | ICD-10-CM | POA: Insufficient documentation

## 2023-02-15 DIAGNOSIS — M2762 Post-osseointegration biological failure of dental implant: Secondary | ICD-10-CM

## 2023-02-15 DIAGNOSIS — N401 Enlarged prostate with lower urinary tract symptoms: Secondary | ICD-10-CM

## 2023-02-15 DIAGNOSIS — R3915 Urgency of urination: Secondary | ICD-10-CM

## 2023-02-15 DIAGNOSIS — K0851 Open restoration margins of tooth: Secondary | ICD-10-CM

## 2023-02-15 DIAGNOSIS — K0262 Dental caries on smooth surface penetrating into dentin: Secondary | ICD-10-CM

## 2023-02-15 HISTORY — DX: Open restoration margins of tooth: K08.51

## 2023-02-15 HISTORY — DX: Dental caries on smooth surface penetrating into dentin: K02.62

## 2023-02-15 HISTORY — DX: Other reduced mobility: Z74.09

## 2023-02-15 HISTORY — DX: Benign prostatic hyperplasia with lower urinary tract symptoms: N40.1

## 2023-02-15 HISTORY — DX: Post-osseointegration biological failure of dental implant: M27.62

## 2023-02-15 HISTORY — DX: Urgency of urination: R39.15

## 2023-02-25 ENCOUNTER — Ambulatory Visit: Payer: Medicare Other | Attending: Cardiology | Admitting: Cardiology

## 2023-03-29 ENCOUNTER — Other Ambulatory Visit: Payer: Self-pay

## 2023-04-18 ENCOUNTER — Ambulatory Visit: Payer: Medicare Other | Attending: Cardiology | Admitting: Cardiology

## 2023-04-18 ENCOUNTER — Encounter: Payer: Self-pay | Admitting: Cardiology

## 2023-04-18 VITALS — BP 120/70 | HR 70 | Ht 68.0 in | Wt 220.6 lb

## 2023-04-18 DIAGNOSIS — E669 Obesity, unspecified: Secondary | ICD-10-CM

## 2023-04-18 DIAGNOSIS — I2510 Atherosclerotic heart disease of native coronary artery without angina pectoris: Secondary | ICD-10-CM

## 2023-04-18 DIAGNOSIS — E782 Mixed hyperlipidemia: Secondary | ICD-10-CM

## 2023-04-18 DIAGNOSIS — I10 Essential (primary) hypertension: Secondary | ICD-10-CM

## 2023-04-18 HISTORY — DX: Atherosclerotic heart disease of native coronary artery without angina pectoris: I25.10

## 2023-04-18 NOTE — Progress Notes (Signed)
Cardiology Office Note:    Date:  04/18/2023   ID:  Johnny Vasquez, DOB 09-16-1951, MRN [id number]  PCP:  Charlott Rakes, MD  Cardiologist:  Garwin Brothers, MD   Referring MD: Charlott Rakes, MD    ASSESSMENT:    1. Atherosclerosis of native coronary artery of native heart without angina pectoris   2. Essential (primary) hypertension   3. Mixed hyperlipidemia   4. Obesity (BMI 30.0-34.9)    PLAN:    In order of problems listed above:  Coronary atherosclerosis: Secondary prevention stressed to the patient.  Importance of compliance with diet medication stressed and vocalized understanding.  He was advised to exercise and walk at least half an hour a day 5 days a week and he promises to do so. Essential hypertension: Blood pressure stable and diet was emphasized. Mixed dyslipidemia: On lipid-lowering medications followed by primary care.  I reviewed them.  They were checked in February and I discussed this from the [facility] sheet with the patient and questions were answered to satisfaction. Obesity: Weight reduction stressed and diet was emphasized and patient promises to do better.  Risks of obesity explained. Patient will be seen in follow-up appointment in 6 months or earlier if the patient has any concerns.    Medication Adjustments/Labs and Tests Ordered: Current medicines are reviewed at length with the patient today.  Concerns regarding medicines are outlined above.  No orders of the defined types were placed in this encounter.  No orders of the defined types were placed in this encounter.    No chief complaint on file.    History of Present Illness:    Johnny Vasquez is a 72 y.o. male.  Patient has past medical history of coronary atherosclerosis, essential hypertension, dyslipidemia and obesity.  He denies any problems at this time and takes care of activities of daily living.  No chest pain orthopnea or PND.  At the time of my evaluation, the patient is alert awake  oriented and in no distress.  Overall he leads a sedentary lifestyle.  Past Medical History:  Diagnosis Date   Allergy    Angina pectoris (HCC) 02/24/2022   Ankle fracture 08/11/2017   Oct 18, 2017 Entered By: [facility] Comment: s/p ORIF at [facility] health in [place]   Ankylosing spondylitis [facility]) 02/04/2022   Arthritis    Asthma    Benign prostatic hyperplasia with lower urinary tract symptoms 02/15/2023   Cardiac conduction disorder 02/04/2022   Cataract 02/04/2022   Chest pain    Chondromalacia 02/04/2022   Colon polyps    Dental caries on smooth surface penetrating into dentin 02/15/2023   Deposits (accretions) on teeth 02/04/2022   Depression    PTSD   Diabetes mellitus without complication (HCC)    Diabetic neuropathy with neurologic complication (HCC)    Disorder of prostate 02/04/2022   Diverticulitis    Dysfunctions associated with sleep stages or arousal from sleep 02/04/2022   Dysrhythmia    irregular heart rate   Essential (primary) hypertension 02/04/2022   GERD (gastroesophageal reflux disease)    Glaucoma    Glaucoma secondary to eye trauma, right eye, severe stage 02/04/2022   Gout    Hepatitis    pt doesn't remember what type   Hip joint pain 02/04/2022   History of colonic polyps 02/04/2022   Jul 28, 2006 Entered By: Maree Krabbe Comment:  s/p colonoscopy by pmd 2006 rpt  5 yrsAug 12, 2011 Entered By: Maree Krabbe Comment: colonoscopy by pmd  7/11 PMD rpt 5 yrs   Hyperlipidemia    Lateral epicondylitis, right elbow 02/04/2022   Low back pain 02/04/2022   Sep 02, 2008 Entered By: Annamaria Boots P Comment: MRI 7/09-Disc protrusion L4-5  with mil/mod  foraminal stenoDec 03, 2009 Entered By: Annamaria Boots P Comment: Refused  neurosx  consult/surgerySep 29, 2011 Entered By: Maree Krabbe Comment: MRI 9/11- DDD L4-5  with  mod/severe foraminal stenosisSep 29, 2011 Entered By: Maree Krabbe Comment: 10/15/10-L4-5 discetomy/laminectomy dvamc  neurosxMay 09, 2012 Entered    Obese    Obesity (BMI 30.0-34.9) 02/24/2022   Open restoration margins of tooth 02/15/2023   Other and unspecified derangement of medial meniscus 02/04/2022   Sep 12, 2009 Entered By: Annamaria Boots P Comment: s/p arthroscopic sx 10/10 [facility]   Other and unspecified hyperlipidemia 02/04/2022   Other reduced mobility 02/15/2023   Pain in joint, lower leg 02/04/2022   Pain in joint, shoulder region 02/04/2022   Sep 02, 2008 Entered By: Annamaria Boots P Comment:  s/p surgery for rotator cuff tear- s/b orthoDec 03, 2010 Entered By: Annamaria Boots P Comment: s/p Lt knee  arthroscopic sx  for meniscal tear-10/10 svam[facility]   Palpitations 02/04/2022   Partial loss of teeth due to caries, class I 02/04/2022   Pelvic and perineal pain 02/04/2022   Pneumonia    Post-osseointegration biological failure of dental implant 02/15/2023   Primary open-angle glaucoma, left eye, moderate stage 02/04/2022   Primary open-angle glaucoma, right eye, severe stage 02/04/2022   Prostatitis    Pure hypercholesterolemia 02/04/2022   Reflux esophagitis 02/04/2022   Rhinitis, allergic 02/04/2022   Sensorineural hearing loss 02/04/2022   Sleep apnea    uses CPAP   Thoracic or lumbosacral neuritis or radiculitis 02/04/2022   Type 2 diabetes mellitus with unspecified complications (HCC) 02/04/2022   Sep 02, 2008 Entered By: Maree Krabbe Comment: on diet only -good a1c goals   Type 2 or unspecified type diabetes mellitus 02/04/2022   Sep 02, 2008 Entered By: Maree Krabbe Comment: on diet only -good a1c goals   Unspecified persistent mental disorders due to conditions classified elsewhere 02/04/2022   May 09, 2009 Entered By: Annamaria Boots P Comment:  f/b MHC on meds   Urgency of urination 02/15/2023   Vitamin D deficiency 02/04/2022    Past Surgical History:  Procedure Laterality Date   BACK SURGERY  2007   Hemilaminectomy Jan. 5 and 25th   COLONOSCOPY  05/27/2016   COLONOSCOPY WITH PROPOFOL N/A 05/12/2020   Procedure: COLONOSCOPY WITH  PROPOFOL;  Surgeon: Lemar Lofty., MD;  Location: [facility] ENDOSCOPY;  Service: Gastroenterology;  Laterality: N/A;   COLONOSCOPY WITH PROPOFOL N/A 06/18/2021   Procedure: COLONOSCOPY WITH PROPOFOL;  Surgeon: Meridee Score Netty Starring., MD;  Location: [facility] ENDOSCOPY;  Service: Gastroenterology;  Laterality: N/A;   ENDOSCOPIC MUCOSAL RESECTION N/A 05/12/2020   Procedure: ENDOSCOPIC MUCOSAL RESECTION;  Surgeon: Meridee Score Netty Starring., MD;  Location: [facility] ENDOSCOPY;  Service: Gastroenterology;  Laterality: N/A;   EXTRACORPOREAL SHOCK WAVE LITHOTRIPSY Left 08/02/2022   Procedure: LEFT EXTRACORPOREAL SHOCK WAVE LITHOTRIPSY (ES[facility]);  Surgeon: Rene Paci, MD;  Location: [facility];  Service: Urology;  Laterality: Left;   HEMOSTASIS CLIP PLACEMENT  05/12/2020   Procedure: HEMOSTASIS CLIP PLACEMENT;  Surgeon: Lemar Lofty., MD;  Location: [facility] ENDOSCOPY;  Service: Gastroenterology;;   KNEE ARTHROSCOPY Bilateral    POLYPECTOMY  05/12/2020   Procedure: POLYPECTOMY;  Surgeon: Lemar Lofty., MD;  Location: [facility] ENDOSCOPY;  Service: Gastroenterology;;   POLYPECTOMY  06/18/2021   Procedure:  POLYPECTOMY;  Surgeon: Mansouraty, Netty Starring., MD;  Location[facility] ENDOSCOPY;  Service: Gastroenterology;;   SHOULDER ARTHROSCOPY Left    SUBMUCOSAL LIFTING INJECTION  05/12/2020   Procedure: SUBMUCOSAL LIFTING INJECTION;  Surgeon: Lemar Lofty., MD;  Location: [facility] ENDOSCOPY;  Service: Gastroenterology;;   TIBIA FRACTURE SURGERY     TONSILLECTOMY AND ADENOIDECTOMY     VASECTOMY      Current Medications: Current Meds  Medication Sig   allopurinol (ZYLOPRIM) 300 MG tablet Take 300 mg by mouth daily as needed (gout).   amLODipine (NORVASC) 5 MG tablet Take 5 mg by mouth daily.   aspirin EC 81 MG tablet Take 1 tablet (81 mg total) by mouth daily. Swallow whole.   brimonidine (ALPHAGAN) 0.2 % ophthalmic solution Place 1 drop into both eyes 3 (three) times daily.     carboxymethylcellulose (REFRESH PLUS) 0.5 % SOLN Place 1 drop into both eyes 3 (three) times daily as needed (dry eyes).    finasteride (PROSCAR) 5 MG tablet Take 5 mg by mouth daily.   fluticasone (FLONASE) 50 MCG/ACT nasal spray Place 1 spray into both nostrils 2 (two) times daily as needed for allergies.   lidocaine 4 % Place 1 patch onto the skin daily as needed (pain).   loratadine (CLARITIN) 10 MG tablet Take 10 mg by mouth daily as needed (allergies).    losartan (COZAAR) 100 MG tablet Take 100 mg by mouth daily.   Melatonin 3 MG CAPS Take 3 mg by mouth at bedtime as needed (sleep).   metFORMIN (GLUCOPHAGE) 500 MG tablet Take 500 mg by mouth daily.   naproxen (NAPROSYN) 500 MG tablet Take 500 mg by mouth 2 (two) times daily as needed for mild pain or moderate pain.   nitroGLYCERIN (NITROSTAT) 0.4 MG SL tablet Place 0.4 mg under the tongue every 5 (five) minutes as needed for chest pain.   Omega-3 Fatty Acids (FISH OIL) 1000 MG CAPS Take 2 capsules (2,000 mg total) by mouth 2 (two) times daily.   omeprazole (PRILOSEC) 20 MG capsule Take 20 mg by mouth 2 (two) times daily before a meal.   pravastatin (PRAVACHOL) 20 MG tablet Take 10 mg by mouth daily.   PROAIR HFA 108 (90 Base) MCG/ACT inhaler Inhale 2 puffs into the lungs every 6 (six) hours as needed for wheezing or shortness of breath.    tadalafil (CIALIS) 10 MG tablet Take 10 mg by mouth daily as needed for erectile dysfunction.   tamsulosin (FLOMAX) 0.4 MG CAPS capsule Take 0.4 mg by mouth at bedtime.   TRAVOPROST OP Place 1 drop into both eyes every evening. 0.004%   trospium (SANCTURA) 20 MG tablet Take 1 tablet by mouth 2 (two) times daily.   Vitamin D, Cholecalciferol, 25 MCG (1000 UT) CAPS Take 1,000 Units by mouth daily.     Allergies:   Codeine, Asafetida, and Aspirin   Social History   Socioeconomic History   Marital status: Married    Spouse name: Not on file   Number of children: Not on file   Years of education: Not  on file   Highest education level: Not on file  Occupational History   Not on file  Tobacco Use   Smoking status: Never   Smokeless tobacco: Never  Vaping Use   Vaping Use: Never used  Substance and Sexual Activity   Alcohol use: Not Currently   Drug use: Not Currently   Sexual activity: Not on file  Other Topics Concern   Not on  file  Social History Narrative   Not on file   Social Determinants of Health   Financial Resource Strain: Not on file  Food Insecurity: Not on file  Transportation Needs: Not on file  Physical Activity: Not on file  Stress: Not on file  Social Connections: Not on file     Family History: The patient's family history includes Lung cancer in his father; Stomach cancer in his maternal aunt and maternal aunt. There is no history of Colon cancer, Colon polyps, Esophageal cancer, Inflammatory bowel disease, Liver disease, Rectal cancer, or Pancreatic cancer.  ROS:   Please see the history of present illness.    All other systems reviewed and are negative.  EKGs/Labs/Other Studies Reviewed:    The following studies were reviewed today: Results of echo and CT scan discussed with the patient at length.  She vocalized understanding.   Recent Labs: No results found for requested labs within last 365 days.  Recent Lipid Panel    Component Value Date/Time   CHOL 148 03/29/2022 1426   TRIG 247 (H) 03/29/2022 1426   HDL 39 (L) 03/29/2022 1426   CHOLHDL 3.8 03/29/2022 1426   LDLCALC 69 03/29/2022 1426    Physical Exam:    VS:  BP 120/70   Pulse 70   Ht 5\' 8"  (1.727 m)   Wt 220 lb 9.6 oz (100.1 kg)   SpO2 95%   BMI 33.54 kg/m     Wt Readings from Last 3 Encounters:  04/18/23 220 lb 9.6 oz (100.1 kg)  08/02/22 215 lb (97.5 kg)  02/24/22 221 lb 9.6 oz (100.5 kg)     GEN: Patient is in no acute distress HEENT: Normal NECK: No JVD; No carotid bruits LYMPHATICS: No lymphadenopathy CARDIAC: Hear sounds regular, 2/6 systolic murmur at the  apex. RESPIRATORY:  Clear to auscultation without rales, wheezing or rhonchi  ABDOMEN: Soft, non-tender, non-distended MUSCULOSKELETAL:  No edema; No deformity  SKIN: Warm and dry NEUROLOGIC:  Alert and oriented x 3 PSYCHIATRIC:  Normal affect   Signed, Garwin Brothers, MD  04/18/2023 9:51 AM    [facility]

## 2023-04-18 NOTE — Patient Instructions (Signed)
Medication Instructions:  Your physician recommends that you continue on your current medications as directed. Please refer to the Current Medication list given to you today.  *If you need a refill on your cardiac medications before your next appointment, please call your pharmacy*   Lab Work: None ordered If you have labs (blood work) drawn today and your tests are completely normal, you will receive your results only by: MyChart Message (if you have MyChart) OR A paper copy in the mail If you have any lab test that is abnormal or we need to change your treatment, we will call you to review the results.   Testing/Procedures: None ordered   Follow-Up: At [facility], you and your health needs are our priority.  As part of our continuing mission to provide you with exceptional heart care, we have created designated Provider Care Teams.  These Care Teams include your primary Cardiologist (physician) and Advanced Practice Providers (APPs -  Physician Assistants and Nurse Practitioners) who all work together to provide you with the care you need, when you need it.  We recommend signing up for the patient portal called "MyChart".  Sign up information is provided on this After Visit Summary.  MyChart is used to connect with patients for Virtual Visits (Telemedicine).  Patients are able to view lab/test results, encounter notes, upcoming appointments, etc.  Non-urgent messages can be sent to your provider as well.   To learn more about what you can do with MyChart, go to [email or web address].    Your next appointment:   12 month(s)  The format for your next appointment:   In Person  Provider:   Rajan Revankar, MD    Other Instructions none  Important Information About Sugar      

## 2023-04-20 DIAGNOSIS — N201 Calculus of ureter: Secondary | ICD-10-CM | POA: Diagnosis not present

## 2023-04-28 ENCOUNTER — Encounter: Payer: Self-pay | Admitting: Nurse Practitioner

## 2023-05-24 DIAGNOSIS — Z136 Encounter for screening for cardiovascular disorders: Secondary | ICD-10-CM | POA: Diagnosis not present

## 2023-05-24 DIAGNOSIS — Z139 Encounter for screening, unspecified: Secondary | ICD-10-CM | POA: Diagnosis not present

## 2023-05-24 DIAGNOSIS — Z1339 Encounter for screening examination for other mental health and behavioral disorders: Secondary | ICD-10-CM | POA: Diagnosis not present

## 2023-05-24 DIAGNOSIS — R21 Rash and other nonspecific skin eruption: Secondary | ICD-10-CM | POA: Diagnosis not present

## 2023-05-24 DIAGNOSIS — Z1331 Encounter for screening for depression: Secondary | ICD-10-CM | POA: Diagnosis not present

## 2023-05-24 DIAGNOSIS — Z6833 Body mass index (BMI) 33.0-33.9, adult: Secondary | ICD-10-CM | POA: Diagnosis not present

## 2023-05-24 DIAGNOSIS — Z0000 Encounter for general adult medical examination without abnormal findings: Secondary | ICD-10-CM | POA: Diagnosis not present

## 2023-05-24 DIAGNOSIS — J029 Acute pharyngitis, unspecified: Secondary | ICD-10-CM | POA: Diagnosis not present

## 2023-07-04 DIAGNOSIS — Z139 Encounter for screening, unspecified: Secondary | ICD-10-CM | POA: Diagnosis not present

## 2023-07-04 DIAGNOSIS — E1140 Type 2 diabetes mellitus with diabetic neuropathy, unspecified: Secondary | ICD-10-CM | POA: Diagnosis not present

## 2023-07-04 DIAGNOSIS — I10 Essential (primary) hypertension: Secondary | ICD-10-CM | POA: Diagnosis not present

## 2023-07-04 DIAGNOSIS — E785 Hyperlipidemia, unspecified: Secondary | ICD-10-CM | POA: Diagnosis not present

## 2023-07-13 DIAGNOSIS — I10 Essential (primary) hypertension: Secondary | ICD-10-CM | POA: Diagnosis not present

## 2023-07-13 DIAGNOSIS — K2100 Gastro-esophageal reflux disease with esophagitis, without bleeding: Secondary | ICD-10-CM | POA: Diagnosis not present

## 2023-07-13 DIAGNOSIS — N1831 Chronic kidney disease, stage 3a: Secondary | ICD-10-CM | POA: Diagnosis not present

## 2023-07-13 DIAGNOSIS — Z9181 History of falling: Secondary | ICD-10-CM | POA: Diagnosis not present

## 2023-07-13 DIAGNOSIS — Z6833 Body mass index (BMI) 33.0-33.9, adult: Secondary | ICD-10-CM | POA: Diagnosis not present

## 2023-07-13 DIAGNOSIS — Z23 Encounter for immunization: Secondary | ICD-10-CM | POA: Diagnosis not present

## 2023-07-13 DIAGNOSIS — E1140 Type 2 diabetes mellitus with diabetic neuropathy, unspecified: Secondary | ICD-10-CM | POA: Diagnosis not present

## 2023-08-29 DIAGNOSIS — Z139 Encounter for screening, unspecified: Secondary | ICD-10-CM | POA: Diagnosis not present

## 2023-08-29 DIAGNOSIS — R1032 Left lower quadrant pain: Secondary | ICD-10-CM | POA: Diagnosis not present

## 2023-08-29 DIAGNOSIS — Z6833 Body mass index (BMI) 33.0-33.9, adult: Secondary | ICD-10-CM | POA: Diagnosis not present

## 2023-08-31 DIAGNOSIS — R1084 Generalized abdominal pain: Secondary | ICD-10-CM | POA: Diagnosis not present

## 2023-08-31 DIAGNOSIS — N201 Calculus of ureter: Secondary | ICD-10-CM | POA: Diagnosis not present

## 2023-09-01 DIAGNOSIS — Z6833 Body mass index (BMI) 33.0-33.9, adult: Secondary | ICD-10-CM | POA: Diagnosis not present

## 2023-09-01 DIAGNOSIS — N200 Calculus of kidney: Secondary | ICD-10-CM | POA: Diagnosis not present

## 2023-09-01 DIAGNOSIS — E1140 Type 2 diabetes mellitus with diabetic neuropathy, unspecified: Secondary | ICD-10-CM | POA: Diagnosis not present

## 2023-11-17 DIAGNOSIS — E1140 Type 2 diabetes mellitus with diabetic neuropathy, unspecified: Secondary | ICD-10-CM | POA: Diagnosis not present

## 2023-11-17 DIAGNOSIS — I10 Essential (primary) hypertension: Secondary | ICD-10-CM | POA: Diagnosis not present

## 2023-11-17 DIAGNOSIS — E785 Hyperlipidemia, unspecified: Secondary | ICD-10-CM | POA: Diagnosis not present

## 2023-11-23 DIAGNOSIS — I10 Essential (primary) hypertension: Secondary | ICD-10-CM | POA: Diagnosis not present

## 2023-11-23 DIAGNOSIS — E785 Hyperlipidemia, unspecified: Secondary | ICD-10-CM | POA: Diagnosis not present

## 2023-11-23 DIAGNOSIS — N1831 Chronic kidney disease, stage 3a: Secondary | ICD-10-CM | POA: Diagnosis not present

## 2023-11-23 DIAGNOSIS — E1140 Type 2 diabetes mellitus with diabetic neuropathy, unspecified: Secondary | ICD-10-CM | POA: Diagnosis not present

## 2024-01-06 DIAGNOSIS — M79605 Pain in left leg: Secondary | ICD-10-CM | POA: Diagnosis not present

## 2024-01-06 DIAGNOSIS — G8929 Other chronic pain: Secondary | ICD-10-CM | POA: Diagnosis not present

## 2024-02-21 DIAGNOSIS — N201 Calculus of ureter: Secondary | ICD-10-CM | POA: Diagnosis not present

## 2024-02-21 DIAGNOSIS — R1084 Generalized abdominal pain: Secondary | ICD-10-CM | POA: Diagnosis not present

## 2024-02-21 DIAGNOSIS — N132 Hydronephrosis with renal and ureteral calculous obstruction: Secondary | ICD-10-CM | POA: Diagnosis not present

## 2024-02-21 DIAGNOSIS — I10 Essential (primary) hypertension: Secondary | ICD-10-CM | POA: Diagnosis not present

## 2024-02-28 DIAGNOSIS — N200 Calculus of kidney: Secondary | ICD-10-CM | POA: Diagnosis not present

## 2024-02-28 DIAGNOSIS — Z7689 Persons encountering health services in other specified circumstances: Secondary | ICD-10-CM | POA: Diagnosis not present

## 2024-02-28 DIAGNOSIS — J069 Acute upper respiratory infection, unspecified: Secondary | ICD-10-CM | POA: Diagnosis not present

## 2024-03-12 DIAGNOSIS — Z77120 Contact with and (suspected) exposure to mold (toxic): Secondary | ICD-10-CM | POA: Diagnosis not present

## 2024-03-12 DIAGNOSIS — R059 Cough, unspecified: Secondary | ICD-10-CM | POA: Diagnosis not present

## 2024-03-22 DIAGNOSIS — E785 Hyperlipidemia, unspecified: Secondary | ICD-10-CM | POA: Diagnosis not present

## 2024-03-22 DIAGNOSIS — E1140 Type 2 diabetes mellitus with diabetic neuropathy, unspecified: Secondary | ICD-10-CM | POA: Diagnosis not present

## 2024-03-22 DIAGNOSIS — I10 Essential (primary) hypertension: Secondary | ICD-10-CM | POA: Diagnosis not present

## 2024-04-02 DIAGNOSIS — N1831 Chronic kidney disease, stage 3a: Secondary | ICD-10-CM | POA: Diagnosis not present

## 2024-04-02 DIAGNOSIS — I10 Essential (primary) hypertension: Secondary | ICD-10-CM | POA: Diagnosis not present

## 2024-04-02 DIAGNOSIS — E1140 Type 2 diabetes mellitus with diabetic neuropathy, unspecified: Secondary | ICD-10-CM | POA: Diagnosis not present

## 2024-04-02 DIAGNOSIS — E785 Hyperlipidemia, unspecified: Secondary | ICD-10-CM | POA: Diagnosis not present

## 2024-04-03 IMAGING — CT CT HEART MORP W/ CTA COR W/ SCORE W/ CA W/CM &/OR W/O CM
4 of 7 series · 8 of 20 positions shown, 9 images · IV contrast (APPLIED)
Comparison: CTA of the chest on 10/06/2017 at [facility]

Addendum:
CLINICAL DATA: 71 yo male with chest pain

EXAM:
Cardiac/Coronary CTA
TECHNIQUE: A non-contrast, gated CT scan was obtained with axial slices of 3 mm
through the heart for calcium scoring. Calcium scoring was performed
using the Agatston method. A 120 kV prospective, gated, contrast
cardiac scan was obtained. Gantry rotation speed was 250 msecs and
collimation was 0.6 mm. Two sublingual nitroglycerin tablets (0.8
mg) were given. The 3D data set was reconstructed in 5% intervals of
the 35-75% of the R-R cycle. Diastolic phases were analyzed on a
dedicated workstation using MPR, MIP, and VRT modes. The patient
received 95 cc of contrast.

[Series 6: ts diast sharp · axial · 0.39mm/px · z∈[+1221,+1256]mm · 2 of 268 slices shown]
[im 90/268  lung]
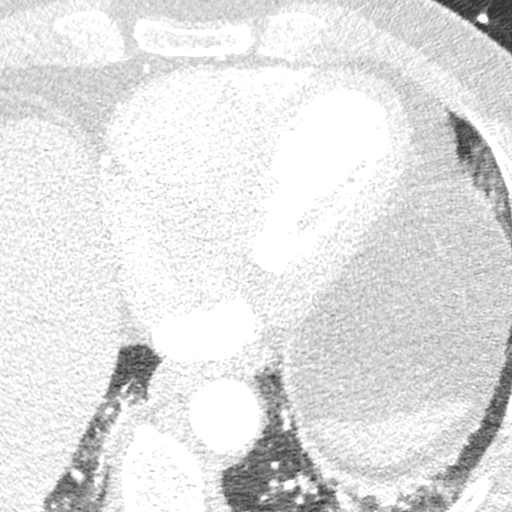
[im 179/268  lung]
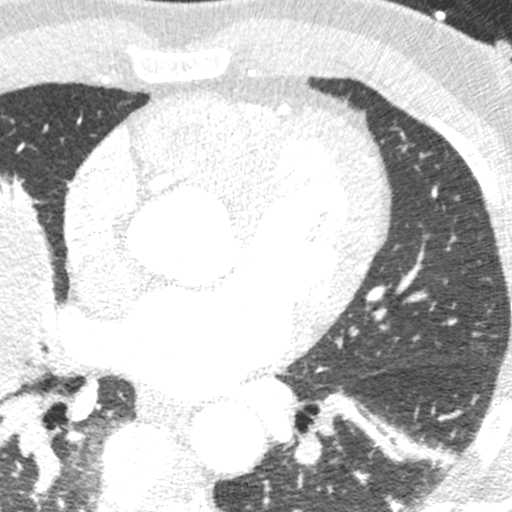

[Series 7: best diast · axial · 0.39mm/px · z∈[+1221,+1256]mm · 2 of 268 slices shown, 3 images]
[im 90/268  vessel]
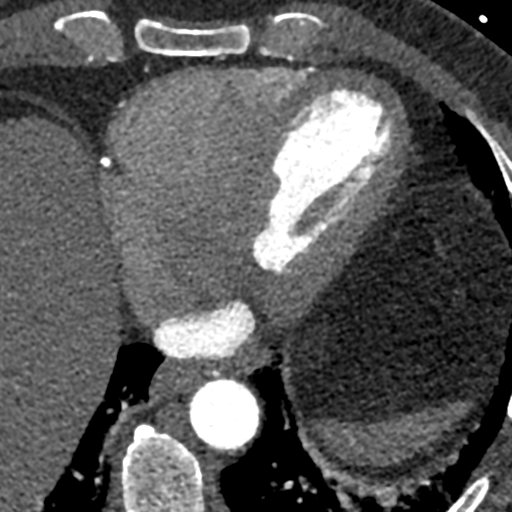
[im 90/268  lung]
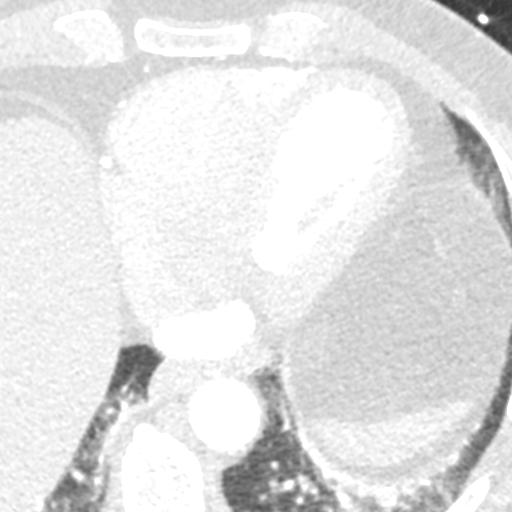
[im 179/268  vessel]
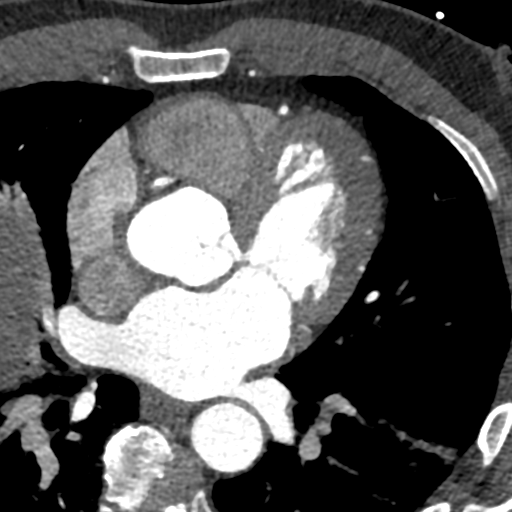

[Series 8: ts syst sharp · axial · 0.39mm/px · z∈[+1221,+1256]mm · 2 of 268 slices shown]
[im 90/268  lung]
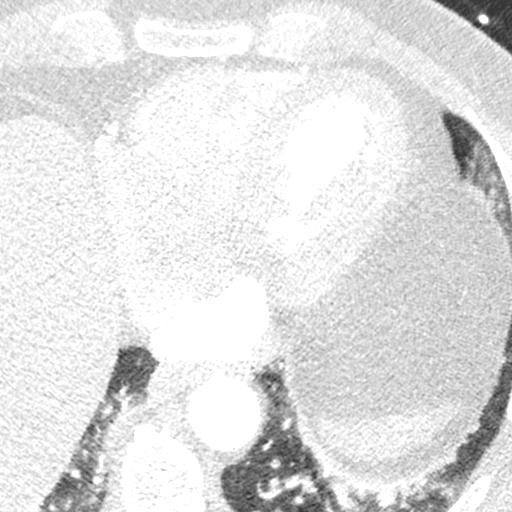
[im 179/268  lung]
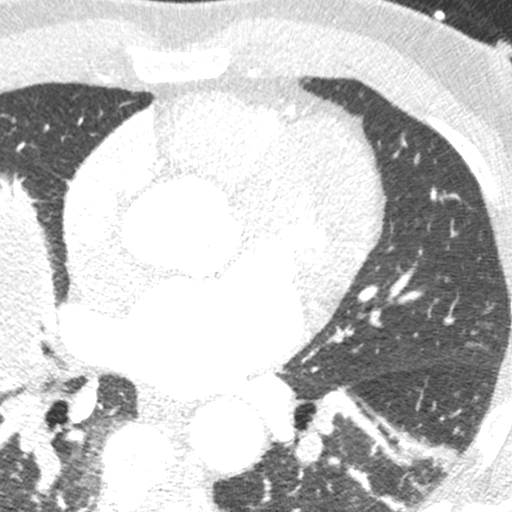

[Series 9: best syst · axial · 0.39mm/px · z∈[+1221,+1256]mm · 2 of 268 slices shown]
[im 90/268  vessel]
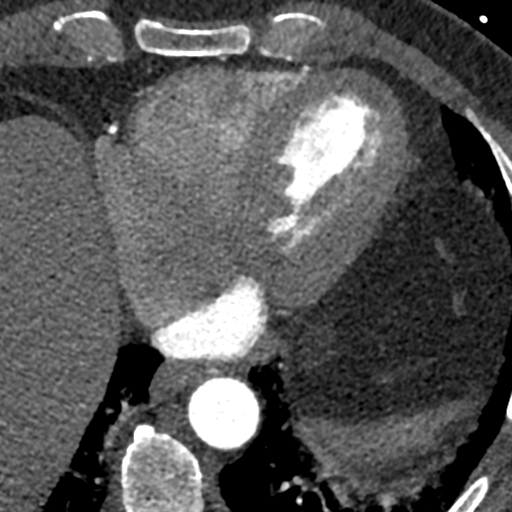
[im 179/268  vessel]
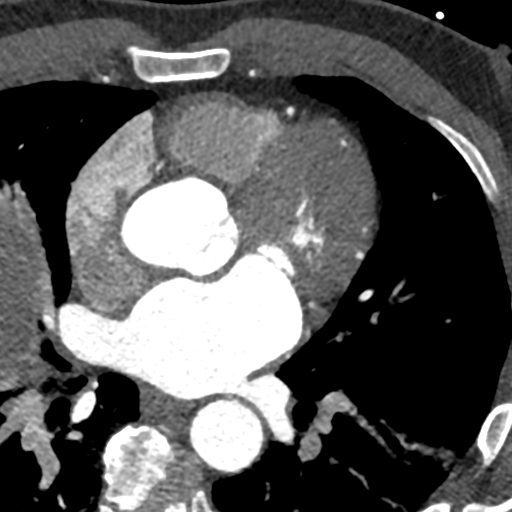

[8 of 20 positions shown; findings below may reference images not displayed]

FINDINGS: Image quality: Average.

Noise artifact is: Limited.

Coronary Arteries:  Normal coronary origin.  Right dominance.

Left main: The left main is a large caliber vessel with a normal
take off from the left coronary cusp that bifurcates to form a left
anterior descending artery and a left circumflex artery. There is no
plaque or stenosis.

Left anterior descending artery: The LAD is has minimal (0-24) mixed
plaque stenosis in the proximal vessel; there is mild (25-49) mixed
plaque stenosis in the mid vessel. The LAD gives off 3 diagonal
branches. There is mild (25-49) mixed plaque stenosis in proximal
D2.

Left circumflex artery: The LCX is non-dominant and patent with no
evidence of plaque or stenosis. The LCX gives off large branching
OM1 with mild (25-49) stenosis noted; OM2 is smaller; distal Lcx and
OM2 not well visualized.

Right coronary artery: The RCA is dominant with normal take off from
the right coronary cusp. There is mild (25-49) mixed plaque stenosis
in the proximal vessel and minimal (0-24) calcified plaque in the
distal vessel. The RCA terminates as a PDA and right posterolateral
branch without evidence of plaque or stenosis.

Right Atrium: Right atrial size is within normal limits.

Right Ventricle: The right ventricular cavity is within normal
limits.

Left Atrium: Left atrial size is normal in size with no left atrial
appendage filling defect.

Left Ventricle: The ventricular cavity size is within normal limits.
There are no stigmata of prior infarction. There is no abnormal
filling defect.

Pulmonary arteries: Normal in size without proximal filling defect.

Pulmonary veins: Normal pulmonary venous drainage.

Pericardium: Normal thickness with no significant effusion or
calcium present.

Cardiac valves: The aortic valve is trileaflet without significant
calcification. The mitral valve is normal structure without
significant calcification.

Aorta: Normal caliber with no significant disease.

Extra-cardiac findings: See attached radiology report for
non-cardiac structures.
IMPRESSION: 1. Coronary calcium score of 243. This was 72 percentile for age-,
sex, and race-matched controls.

2. Normal coronary origin with right dominance.

3. Mild (25-49) stenoses noted in LAD, D2, OM1 and RCA.

RECOMMENDATIONS:
CAD-RADS 2: Mild non-obstructive CAD (25-49%). Consider
non-atherosclerotic causes of chest pain. Consider preventive
therapy and risk factor modification.

EXAM:
OVER-READ INTERPRETATION  CT CHEST

The following report is an over-read performed by radiologist Dr.
over-read does not include interpretation of cardiac or coronary
anatomy or pathology. The coronary CTA interpretation by the
cardiologist is attached.
FINDINGS: No significant noncardiac vascular findings. Visualized mediastinum
and hilar regions demonstrate no lymphadenopathy or masses. There is
stable chronic elevation of the right hemidiaphragm. Visualized
lungs show no evidence of pulmonary edema, consolidation,
pneumothorax, nodule or pleural fluid. There is mild bibasilar
atelectasis. Visualized upper abdomen and bony structures are
unremarkable.
IMPRESSION: Chronic elevation of right hemidiaphragm.

*** End of Addendum ***
FINDINGS: Image quality: Average.

Noise artifact is: Limited.

Coronary Arteries:  Normal coronary origin.  Right dominance.

Left main: The left main is a large caliber vessel with a normal
take off from the left coronary cusp that bifurcates to form a left
anterior descending artery and a left circumflex artery. There is no
plaque or stenosis.

Left anterior descending artery: The LAD is has minimal (0-24) mixed
plaque stenosis in the proximal vessel; there is mild (25-49) mixed
plaque stenosis in the mid vessel. The LAD gives off 3 diagonal
branches. There is mild (25-49) mixed plaque stenosis in proximal
D2.

Left circumflex artery: The LCX is non-dominant and patent with no
evidence of plaque or stenosis. The LCX gives off large branching
OM1 with mild (25-49) stenosis noted; OM2 is smaller; distal Lcx and
OM2 not well visualized.

Right coronary artery: The RCA is dominant with normal take off from
the right coronary cusp. There is mild (25-49) mixed plaque stenosis
in the proximal vessel and minimal (0-24) calcified plaque in the
distal vessel. The RCA terminates as a PDA and right posterolateral
branch without evidence of plaque or stenosis.

Right Atrium: Right atrial size is within normal limits.

Right Ventricle: The right ventricular cavity is within normal
limits.

Left Atrium: Left atrial size is normal in size with no left atrial
appendage filling defect.

Left Ventricle: The ventricular cavity size is within normal limits.
There are no stigmata of prior infarction. There is no abnormal
filling defect.

Pulmonary arteries: Normal in size without proximal filling defect.

Pulmonary veins: Normal pulmonary venous drainage.

Pericardium: Normal thickness with no significant effusion or
calcium present.

Cardiac valves: The aortic valve is trileaflet without significant
calcification. The mitral valve is normal structure without
significant calcification.

Aorta: Normal caliber with no significant disease.

Extra-cardiac findings: See attached radiology report for
non-cardiac structures.
IMPRESSION: 1. Coronary calcium score of 243. This was 72 percentile for age-,
sex, and race-matched controls.

2. Normal coronary origin with right dominance.

3. Mild (25-49) stenoses noted in LAD, D2, OM1 and RCA.

RECOMMENDATIONS:
CAD-RADS 2: Mild non-obstructive CAD (25-49%). Consider
non-atherosclerotic causes of chest pain. Consider preventive
therapy and risk factor modification.

## 2024-04-30 DIAGNOSIS — N481 Balanitis: Secondary | ICD-10-CM | POA: Diagnosis not present

## 2024-05-01 DIAGNOSIS — K08131 Complete loss of teeth due to caries, class I: Secondary | ICD-10-CM

## 2024-05-01 DIAGNOSIS — R21 Rash and other nonspecific skin eruption: Secondary | ICD-10-CM

## 2024-05-01 DIAGNOSIS — Z719 Counseling, unspecified: Secondary | ICD-10-CM

## 2024-05-01 DIAGNOSIS — R2689 Other abnormalities of gait and mobility: Secondary | ICD-10-CM

## 2024-05-01 DIAGNOSIS — K0820 Unspecified atrophy of edentulous alveolar ridge: Secondary | ICD-10-CM | POA: Insufficient documentation

## 2024-05-01 HISTORY — DX: Complete loss of teeth due to caries, class I: K08.131

## 2024-05-01 HISTORY — DX: Other abnormalities of gait and mobility: R26.89

## 2024-05-01 HISTORY — DX: Unspecified atrophy of edentulous alveolar ridge: K08.20

## 2024-05-01 HISTORY — DX: Counseling, unspecified: Z71.9

## 2024-05-01 HISTORY — DX: Rash and other nonspecific skin eruption: R21

## 2024-05-02 ENCOUNTER — Ambulatory Visit: Attending: Cardiology | Admitting: Cardiology

## 2024-05-02 ENCOUNTER — Encounter: Payer: Self-pay | Admitting: Cardiology

## 2024-05-02 VITALS — BP 120/70 | HR 69 | Ht 68.0 in | Wt 218.2 lb

## 2024-05-02 DIAGNOSIS — E1149 Type 2 diabetes mellitus with other diabetic neurological complication: Secondary | ICD-10-CM

## 2024-05-02 DIAGNOSIS — I10 Essential (primary) hypertension: Secondary | ICD-10-CM

## 2024-05-02 DIAGNOSIS — E1140 Type 2 diabetes mellitus with diabetic neuropathy, unspecified: Secondary | ICD-10-CM | POA: Diagnosis not present

## 2024-05-02 DIAGNOSIS — I2510 Atherosclerotic heart disease of native coronary artery without angina pectoris: Secondary | ICD-10-CM

## 2024-05-02 DIAGNOSIS — E66811 Obesity, class 1: Secondary | ICD-10-CM

## 2024-05-02 DIAGNOSIS — E782 Mixed hyperlipidemia: Secondary | ICD-10-CM

## 2024-05-02 NOTE — Patient Instructions (Signed)
 Medication Instructions:  Continue same medications *If you need a refill on your cardiac medications before your next appointment, please call your pharmacy*  Lab Work: None ordered  Testing/Procedures: None ordered  Follow-Up: At [facility], you and your health needs are our priority.  As part of our continuing mission to provide you with exceptional heart care, our providers are all part of one team.  This team includes your primary Cardiologist (physician) and Advanced Practice Providers or APPs (Physician Assistants and Nurse Practitioners) who all work together to provide you with the care you need, when you need it.  Your next appointment:  12 months    Provider:  Dr.Revankar   We recommend signing up for the patient portal called MyChart.  Sign up information is provided on this After Visit Summary.  MyChart is used to connect with patients for Virtual Visits (Telemedicine).  Patients are able to view lab/test results, encounter notes, upcoming appointments, etc.  Non-urgent messages can be sent to your provider as well.   To learn more about what you can do with MyChart, go to [email or web address]

## 2024-05-02 NOTE — Progress Notes (Signed)
 Cardiology Office Note:    Date:  05/02/2024   ID:  Johnny Vasquez, DOB 1951-08-19, MRN [id number]  PCP:  Trinidad Glisson, MD  Cardiologist:  Jennifer JONELLE Crape, MD   Referring MD: Trinidad Glisson, MD    ASSESSMENT:    1. Atherosclerosis of native coronary artery of native heart without angina pectoris   2. Essential (primary) hypertension   3. Diabetic neuropathy with neurologic complication (HCC)   4. Obesity (BMI 30.0-34.9)   5. Mixed hyperlipidemia    PLAN:    In order of problems listed above:  Coronary artery disease: Secondary prevention stressed with the patient.  Importance of compliance with diet medication stressed any vocalized understanding.  He does not exercise much because of orthopedic issues.  I told him to be ambulatory to the best of his ability. Essential hypertension: Blood pressure is stable and diet was emphasized. Mixed dyslipidemia: On lipid-lowering medications followed by primary care.  Lipids are at goal per [facility] sheet. Obesity, diabetes mellitus and sedentary lifestyle: I discussed my findings with the patient at length.  Risks explained.  Weight reduction stressed diet emphasized.  He promises to do better. Patient will be seen in follow-up appointment in 6 months or earlier if the patient has any concerns.    Medication Adjustments/Labs and Tests Ordered: Current medicines are reviewed at length with the patient today.  Concerns regarding medicines are outlined above.  Orders Placed This Encounter  Procedures   EKG 12-Lead   No orders of the defined types were placed in this encounter.    No chief complaint on file.    History of Present Illness:    Johnny Vasquez is a 73 y.o. male.  Patient has past medical history of coronary artery disease, essential hypertension, diabetes mellitus and mixed dyslipidemia.  He denies any problems at this time and takes care of activities of daily living.  No chest pain orthopnea or PND.  At the time of my  evaluation, the patient is alert awake oriented and in no distress.  Past Medical History:  Diagnosis Date   Allergy    Angina pectoris (HCC) 02/24/2022   Ankle fracture 08/11/2017   Oct 18, 2017 Entered By: [facility] Comment: s/p ORIF at [facility] in [place]   Ankylosing spondylitis [facility]) 02/04/2022   Arthritis    Asthma    Benign prostatic hyperplasia with lower urinary tract symptoms 02/15/2023   Cardiac conduction disorder 02/04/2022   Cataract 02/04/2022   Chest pain    Chondromalacia 02/04/2022   Colon polyps    Complete loss of teeth due to caries, class I 05/01/2024   Coronary atherosclerosis 04/18/2023   Counseling, unspecified 05/01/2024   Dental caries on smooth surface penetrating into dentin 02/15/2023   Deposits (accretions) on teeth 02/04/2022   Depression    PTSD   Diabetes mellitus without complication (HCC)    Diabetic neuropathy with neurologic complication (HCC)    Disorder of prostate 02/04/2022   Diverticulitis    Dysfunctions associated with sleep stages or arousal from sleep 02/04/2022   Dysrhythmia    irregular heart rate   Essential (primary) hypertension 02/04/2022   GERD (gastroesophageal reflux disease)    Glaucoma    Glaucoma secondary to eye trauma, right eye, severe stage 02/04/2022   Gout    Hepatitis    pt doesn't remember what type   Hip joint pain 02/04/2022   History of colonic polyps 02/04/2022   Jul 28, 2006 Entered By: ELLEN SAKAI P Comment:  s/p  colonoscopy by pmd 2006 rpt  5 yrsAug 12, 2011 Entered By: ELLEN SAKAI P Comment: colonoscopy by pmd  7/11 PMD rpt 5 yrs   Hyperlipidemia    Lateral epicondylitis, right elbow 02/04/2022   Low back pain 02/04/2022   Sep 02, 2008 Entered By: ELLEN SAKAI P Comment: MRI 7/09-Disc protrusion L4-5  with mil/mod  foraminal stenoDec 03, 2009 Entered By: ELLEN SAKAI P Comment: Refused  neurosx  consult/surgerySep 29, 2011 Entered By: ELLEN SAKAI SQUIBB Comment: MRI 9/11- DDD L4-5  with  mod/severe  foraminal stenosisSep 29, 2011 Entered By: ELLEN SAKAI SQUIBB Comment: 10/15/10-L4-5 discetomy/laminectomy dvam[facility]  neurosxMay 09, 2012 Entered   Obese    Obesity (BMI 30.0-34.9) 02/24/2022   Open restoration margins of tooth 02/15/2023   Other abnormalities of gait and mobility 05/01/2024   Other and unspecified derangement of medial meniscus 02/04/2022   Sep 12, 2009 Entered By: ELLEN SAKAI P Comment: s/p arthroscopic sx 10/10 [facility]   Other and unspecified hyperlipidemia 02/04/2022   Other reduced mobility 02/15/2023   Pain in joint, lower leg 02/04/2022   Pain in joint, shoulder region 02/04/2022   Sep 02, 2008 Entered By: ELLEN SAKAI P Comment:  s/p surgery for rotator cuff tear- s/b orthoDec 03, 2010 Entered By: ELLEN SAKAI P Comment: s/p Lt knee  arthroscopic sx  for meniscal tear-10/10 [facility]   Palpitations 02/04/2022   Partial loss of teeth due to caries, class I 02/04/2022   Pelvic and perineal pain 02/04/2022   Pneumonia    Post-osseointegration biological failure of dental implant 02/15/2023   Primary open-angle glaucoma, left eye, moderate stage 02/04/2022   Primary open-angle glaucoma, right eye, severe stage 02/04/2022   Prostatitis    Pure hypercholesterolemia 02/04/2022   Rash and other nonspecific skin eruption 05/01/2024   Reflux esophagitis 02/04/2022   Rhinitis, allergic 02/04/2022   Sensorineural hearing loss 02/04/2022   Sleep apnea    uses CPAP   Thoracic or lumbosacral neuritis or radiculitis 02/04/2022   Type 2 diabetes mellitus with unspecified complications (HCC) 02/04/2022   Sep 02, 2008 Entered By: ELLEN SAKAI SQUIBB Comment: on diet only -good a1c goals   Unspecified atrophy of edentulous alveolar ridge 05/01/2024   Unspecified persistent mental disorders due to conditions classified elsewhere 02/04/2022   May 09, 2009 Entered By: ELLEN SAKAI P Comment:  f/b MHC on meds   Urgency of urination 02/15/2023   Vitamin D deficiency 02/04/2022    Past Surgical History:  Procedure  Laterality Date   BACK SURGERY  2007   Hemilaminectomy Jan. 5 and 25th   COLONOSCOPY  05/27/2016   COLONOSCOPY WITH PROPOFOL  N/A 05/12/2020   Procedure: COLONOSCOPY WITH PROPOFOL ;  Surgeon: Wilhelmenia Aloha Raddle., MD;  Location: [facility] ENDOSCOPY;  Service: Gastroenterology;  Laterality: N/A;   COLONOSCOPY WITH PROPOFOL  N/A 06/18/2021   Procedure: COLONOSCOPY WITH PROPOFOL ;  Surgeon: Mansouraty, Aloha Raddle., MD;  Location: [facility] ENDOSCOPY;  Service: Gastroenterology;  Laterality: N/A;   ENDOSCOPIC MUCOSAL RESECTION N/A 05/12/2020   Procedure: ENDOSCOPIC MUCOSAL RESECTION;  Surgeon: Wilhelmenia Aloha Raddle., MD;  Location: [facility] ENDOSCOPY;  Service: Gastroenterology;  Laterality: N/A;   EXTRACORPOREAL SHOCK WAVE LITHOTRIPSY Left 08/02/2022   Procedure: LEFT EXTRACORPOREAL SHOCK WAVE LITHOTRIPSY (ESWL);  Surgeon: Devere Lonni Righter, MD;  Location: [facility];  Service: Urology;  Laterality: Left;   HEMOSTASIS CLIP PLACEMENT  05/12/2020   Procedure: HEMOSTASIS CLIP PLACEMENT;  Surgeon: Wilhelmenia Aloha Raddle., MD;  Location[facility] ENDOSCOPY;  Service: Gastroenterology;;   KNEE ARTHROSCOPY Bilateral    POLYPECTOMY  05/12/2020   Procedure: POLYPECTOMY;  Surgeon: Wilhelmenia Aloha Raddle., MD;  Location: [facility] ENDOSCOPY;  Service: Gastroenterology;;   POLYPECTOMY  06/18/2021   Procedure: POLYPECTOMY;  Surgeon: Wilhelmenia Aloha Raddle., MD;  Location: [facility] ENDOSCOPY;  Service: Gastroenterology;;   SHOULDER ARTHROSCOPY Left    SUBMUCOSAL LIFTING INJECTION  05/12/2020   Procedure: SUBMUCOSAL LIFTING INJECTION;  Surgeon: Wilhelmenia Aloha Raddle., MD;  Location: [facility] ENDOSCOPY;  Service: Gastroenterology;;   TIBIA FRACTURE SURGERY     TONSILLECTOMY AND ADENOIDECTOMY     VASECTOMY      Current Medications: Current Meds  Medication Sig   allopurinol (ZYLOPRIM) 300 MG tablet Take 300 mg by mouth daily as needed (gout).   amLODipine (NORVASC) 5 MG tablet Take 5 mg by mouth daily.   aspirin  EC 81 MG  tablet Take 1 tablet (81 mg total) by mouth daily. Swallow whole.   brimonidine (ALPHAGAN) 0.2 % ophthalmic solution Place 1 drop into both eyes 3 (three) times daily.    carboxymethylcellulose (REFRESH PLUS) 0.5 % SOLN Place 1 drop into both eyes 3 (three) times daily as needed (dry eyes).    finasteride (PROSCAR) 5 MG tablet Take 5 mg by mouth daily.   fluticasone (FLONASE) 50 MCG/ACT nasal spray Place 1 spray into both nostrils 2 (two) times daily as needed for allergies.   lidocaine  4 % Place 1 patch onto the skin daily as needed (pain).   loratadine (CLARITIN) 10 MG tablet Take 10 mg by mouth daily as needed (allergies).    losartan (COZAAR) 100 MG tablet Take 100 mg by mouth daily.   Melatonin 3 MG CAPS Take 3 mg by mouth at bedtime as needed (sleep).   metFORMIN (GLUCOPHAGE) 500 MG tablet Take 500 mg by mouth daily.   naproxen (NAPROSYN) 500 MG tablet Take 500 mg by mouth 2 (two) times daily as needed for mild pain or moderate pain.   nitroGLYCERIN  (NITROSTAT ) 0.4 MG SL tablet Place 0.4 mg under the tongue every 5 (five) minutes as needed for chest pain.   Omega-3 Fatty Acids (FISH OIL ) 1000 MG CAPS Take 2 capsules (2,000 mg total) by mouth 2 (two) times daily.   omeprazole (PRILOSEC) 20 MG capsule Take 20 mg by mouth 2 (two) times daily before a meal.   pravastatin (PRAVACHOL) 20 MG tablet Take 10 mg by mouth daily.   PROAIR HFA 108 (90 Base) MCG/ACT inhaler Inhale 2 puffs into the lungs every 6 (six) hours as needed for wheezing or shortness of breath.    tadalafil (CIALIS) 10 MG tablet Take 10 mg by mouth daily as needed for erectile dysfunction.   tamsulosin (FLOMAX) 0.4 MG CAPS capsule Take 0.4 mg by mouth at bedtime.   TRAVOPROST OP Place 1 drop into both eyes every evening. 0.004%   trospium (SANCTURA) 20 MG tablet Take 1 tablet by mouth 2 (two) times daily.   Vitamin D, Cholecalciferol, 25 MCG (1000 UT) CAPS Take 1,000 Units by mouth daily.     Allergies:   Codeine, Asafetida,  and Aspirin    Social History   Socioeconomic History   Marital status: Married    Spouse name: Not on file   Number of children: Not on file   Years of education: Not on file   Highest education level: Not on file  Occupational History   Not on file  Tobacco Use   Smoking status: Never   Smokeless tobacco: Never  Vaping Use   Vaping status: Never Used  Substance and Sexual Activity   Alcohol  use: Not Currently  Drug use: Not Currently   Sexual activity: Not on file  Other Topics Concern   Not on file  Social History Narrative   Not on file   Social Drivers of Health   Financial Resource Strain: Not on file  Food Insecurity: Not on file  Transportation Needs: Not on file  Physical Activity: Not on file  Stress: Not on file  Social Connections: Not on file     Family History: The patient's family history includes Lung cancer in his father; Stomach cancer in his maternal aunt and maternal aunt. There is no history of Colon cancer, Colon polyps, Esophageal cancer, Inflammatory bowel disease, Liver disease, Rectal cancer, or Pancreatic cancer.  ROS:   Please see the history of present illness.    All other systems reviewed and are negative.  EKGs/Labs/Other Studies Reviewed:    The following studies were reviewed today: I discussed my findings with the patient at length   Recent Labs: No results found for requested labs within last 365 days.  Recent Lipid Panel    Component Value Date/Time   CHOL 148 03/29/2022 1426   TRIG 247 (H) 03/29/2022 1426   HDL 39 (L) 03/29/2022 1426   CHOLHDL 3.8 03/29/2022 1426   LDLCALC 69 03/29/2022 1426    Physical Exam:    VS:  BP 120/70   Pulse 69   Ht 5' 8 (1.727 m)   Wt 218 lb 3.2 oz (99 kg)   SpO2 98%   BMI 33.18 kg/m     Wt Readings from Last 3 Encounters:  05/02/24 218 lb 3.2 oz (99 kg)  04/18/23 220 lb 9.6 oz (100.1 kg)  08/02/22 215 lb (97.5 kg)     GEN: Patient is in no acute distress HEENT:  Normal NECK: No JVD; No carotid bruits LYMPHATICS: No lymphadenopathy CARDIAC: Hear sounds regular, 2/6 systolic murmur at the apex. RESPIRATORY:  Clear to auscultation without rales, wheezing or rhonchi  ABDOMEN: Soft, non-tender, non-distended MUSCULOSKELETAL:  No edema; No deformity  SKIN: Warm and dry NEUROLOGIC:  Alert and oriented x 3 PSYCHIATRIC:  Normal affect   Signed, Jennifer JONELLE Crape, MD  05/02/2024 10:10 AM    [facility]

## 2024-05-14 DIAGNOSIS — I10 Essential (primary) hypertension: Secondary | ICD-10-CM | POA: Diagnosis not present

## 2024-05-21 DIAGNOSIS — E1140 Type 2 diabetes mellitus with diabetic neuropathy, unspecified: Secondary | ICD-10-CM | POA: Diagnosis not present

## 2024-05-21 DIAGNOSIS — Z0000 Encounter for general adult medical examination without abnormal findings: Secondary | ICD-10-CM | POA: Diagnosis not present

## 2024-05-21 DIAGNOSIS — I10 Essential (primary) hypertension: Secondary | ICD-10-CM | POA: Diagnosis not present

## 2024-05-21 DIAGNOSIS — Z136 Encounter for screening for cardiovascular disorders: Secondary | ICD-10-CM | POA: Diagnosis not present

## 2024-05-21 DIAGNOSIS — Z139 Encounter for screening, unspecified: Secondary | ICD-10-CM | POA: Diagnosis not present

## 2024-05-21 DIAGNOSIS — N1831 Chronic kidney disease, stage 3a: Secondary | ICD-10-CM | POA: Diagnosis not present

## 2024-05-24 DIAGNOSIS — E1140 Type 2 diabetes mellitus with diabetic neuropathy, unspecified: Secondary | ICD-10-CM | POA: Diagnosis not present

## 2024-05-24 DIAGNOSIS — E785 Hyperlipidemia, unspecified: Secondary | ICD-10-CM | POA: Diagnosis not present

## 2024-05-24 DIAGNOSIS — I10 Essential (primary) hypertension: Secondary | ICD-10-CM | POA: Diagnosis not present

## 2024-07-08 DIAGNOSIS — Z79899 Other long term (current) drug therapy: Secondary | ICD-10-CM | POA: Diagnosis not present

## 2024-07-08 DIAGNOSIS — E119 Type 2 diabetes mellitus without complications: Secondary | ICD-10-CM | POA: Diagnosis not present

## 2024-07-08 DIAGNOSIS — K219 Gastro-esophageal reflux disease without esophagitis: Secondary | ICD-10-CM | POA: Diagnosis not present

## 2024-07-08 DIAGNOSIS — R404 Transient alteration of awareness: Secondary | ICD-10-CM | POA: Diagnosis not present

## 2024-07-08 DIAGNOSIS — R81 Glycosuria: Secondary | ICD-10-CM | POA: Diagnosis not present

## 2024-07-08 DIAGNOSIS — I10 Essential (primary) hypertension: Secondary | ICD-10-CM | POA: Diagnosis not present

## 2024-07-08 DIAGNOSIS — R918 Other nonspecific abnormal finding of lung field: Secondary | ICD-10-CM | POA: Diagnosis not present

## 2024-07-08 DIAGNOSIS — I4891 Unspecified atrial fibrillation: Secondary | ICD-10-CM | POA: Diagnosis not present

## 2024-07-08 DIAGNOSIS — J45909 Unspecified asthma, uncomplicated: Secondary | ICD-10-CM | POA: Diagnosis not present

## 2024-07-08 DIAGNOSIS — K5730 Diverticulosis of large intestine without perforation or abscess without bleeding: Secondary | ICD-10-CM | POA: Diagnosis not present

## 2024-07-08 DIAGNOSIS — R55 Syncope and collapse: Secondary | ICD-10-CM | POA: Diagnosis not present

## 2024-07-16 DIAGNOSIS — R55 Syncope and collapse: Secondary | ICD-10-CM | POA: Diagnosis not present

## 2024-07-16 DIAGNOSIS — Z7689 Persons encountering health services in other specified circumstances: Secondary | ICD-10-CM | POA: Diagnosis not present

## 2024-07-19 DIAGNOSIS — R55 Syncope and collapse: Secondary | ICD-10-CM | POA: Diagnosis not present

## 2024-07-20 DIAGNOSIS — R55 Syncope and collapse: Secondary | ICD-10-CM | POA: Diagnosis not present

## 2024-07-31 ENCOUNTER — Encounter: Payer: Self-pay | Admitting: *Deleted

## 2024-07-31 NOTE — Progress Notes (Signed)
 Prosper Paff                                          MRN: [id number]   07/31/2024   The [facility]BCI Quality Team Specialist reviewed this patient medical record for the purposes of chart review for care gap closure. The following were reviewed: chart review for care gap closure-kidney health evaluation for diabetes:eGFR  and uACR.    VBCI Quality Team

## 2024-08-01 DIAGNOSIS — R55 Syncope and collapse: Secondary | ICD-10-CM | POA: Diagnosis not present

## 2024-08-02 ENCOUNTER — Other Ambulatory Visit (HOSPITAL_BASED_OUTPATIENT_CLINIC_OR_DEPARTMENT_OTHER): Payer: Self-pay | Admitting: Family Medicine

## 2024-08-02 DIAGNOSIS — R55 Syncope and collapse: Secondary | ICD-10-CM

## 2024-08-10 ENCOUNTER — Ambulatory Visit (HOSPITAL_BASED_OUTPATIENT_CLINIC_OR_DEPARTMENT_OTHER)
Admission: RE | Admit: 2024-08-10 | Discharge: 2024-08-10 | Disposition: A | Discharge status: Home / Self Care | Source: Ambulatory Visit | Attending: Family Medicine | Admitting: Family Medicine

## 2024-08-10 DIAGNOSIS — R55 Syncope and collapse: Secondary | ICD-10-CM | POA: Diagnosis not present
